# Patient Record
Sex: Female | Born: 1960 | Race: White | Hispanic: No | Marital: Married | Smoking: Never smoker
Health system: Southern US, Community
[De-identification: ages and names within clinical notes are randomized; demographics above are authoritative.]

## PROBLEM LIST (undated history)

## (undated) DIAGNOSIS — E782 Mixed hyperlipidemia: Secondary | ICD-10-CM

## (undated) DIAGNOSIS — C4491 Basal cell carcinoma of skin, unspecified: Secondary | ICD-10-CM

## (undated) DIAGNOSIS — B019 Varicella without complication: Secondary | ICD-10-CM

## (undated) DIAGNOSIS — M722 Plantar fascial fibromatosis: Secondary | ICD-10-CM

## (undated) DIAGNOSIS — N6019 Diffuse cystic mastopathy of unspecified breast: Secondary | ICD-10-CM

## (undated) DIAGNOSIS — T7840XA Allergy, unspecified, initial encounter: Secondary | ICD-10-CM

## (undated) DIAGNOSIS — C801 Malignant (primary) neoplasm, unspecified: Secondary | ICD-10-CM

## (undated) DIAGNOSIS — M2420 Disorder of ligament, unspecified site: Principal | ICD-10-CM

## (undated) HISTORY — PX: SKIN SURGERY: SHX2413

## (undated) HISTORY — DX: Disorder of ligament, unspecified site: M24.20

## (undated) HISTORY — DX: Allergy, unspecified, initial encounter: T78.40XA

## (undated) HISTORY — DX: Mixed hyperlipidemia: E78.2

## (undated) HISTORY — DX: Basal cell carcinoma of skin, unspecified: C44.91

## (undated) HISTORY — DX: Diffuse cystic mastopathy of unspecified breast: N60.19

## (undated) HISTORY — DX: Plantar fascial fibromatosis: M72.2

## (undated) HISTORY — DX: Varicella without complication: B01.9

## (undated) HISTORY — PX: BREAST SURGERY: SHX581

## (undated) HISTORY — DX: Malignant (primary) neoplasm, unspecified: C80.1

---

## 1998-11-20 ENCOUNTER — Inpatient Hospital Stay (HOSPITAL_COMMUNITY): Admission: RE | Admit: 1998-11-20 | Discharge: 1998-11-20 | Payer: Self-pay | Admitting: *Deleted

## 1998-11-20 ENCOUNTER — Encounter: Payer: Self-pay | Admitting: *Deleted

## 2008-12-08 HISTORY — PX: HERNIA REPAIR: SHX51

## 2011-12-09 LAB — HM PAP SMEAR: HM Pap smear: NORMAL

## 2011-12-09 LAB — HM COLONOSCOPY: HM Colonoscopy: NORMAL

## 2014-02-05 LAB — HM MAMMOGRAPHY: HM Mammogram: NORMAL

## 2014-05-02 ENCOUNTER — Ambulatory Visit (INDEPENDENT_AMBULATORY_CARE_PROVIDER_SITE_OTHER): Payer: Managed Care, Other (non HMO) | Admitting: Family Medicine

## 2014-05-02 ENCOUNTER — Encounter: Payer: Self-pay | Admitting: Family Medicine

## 2014-05-02 VITALS — BP 100/64 | HR 73 | Temp 97.9°F | Ht 67.0 in | Wt 134.1 lb

## 2014-05-02 DIAGNOSIS — N6019 Diffuse cystic mastopathy of unspecified breast: Secondary | ICD-10-CM

## 2014-05-02 DIAGNOSIS — C4491 Basal cell carcinoma of skin, unspecified: Secondary | ICD-10-CM

## 2014-05-02 DIAGNOSIS — T7840XA Allergy, unspecified, initial encounter: Secondary | ICD-10-CM

## 2014-05-02 DIAGNOSIS — E538 Deficiency of other specified B group vitamins: Secondary | ICD-10-CM

## 2014-05-02 DIAGNOSIS — B019 Varicella without complication: Secondary | ICD-10-CM | POA: Insufficient documentation

## 2014-05-02 DIAGNOSIS — Z0000 Encounter for general adult medical examination without abnormal findings: Secondary | ICD-10-CM

## 2014-05-02 NOTE — Progress Notes (Signed)
Pre visit review using our clinic review tool, if applicable. No additional management support is needed unless otherwise documented below in the visit note. 

## 2014-05-02 NOTE — Patient Instructions (Signed)

## 2014-05-05 ENCOUNTER — Telehealth: Payer: Self-pay | Admitting: Family Medicine

## 2014-05-05 NOTE — Telephone Encounter (Signed)
Received medical records from [facility] Para March

## 2014-05-07 ENCOUNTER — Encounter: Payer: Self-pay | Admitting: Family Medicine

## 2014-05-07 DIAGNOSIS — Z0000 Encounter for general adult medical examination without abnormal findings: Secondary | ICD-10-CM | POA: Insufficient documentation

## 2014-05-07 DIAGNOSIS — N6019 Diffuse cystic mastopathy of unspecified breast: Secondary | ICD-10-CM

## 2014-05-07 DIAGNOSIS — E538 Deficiency of other specified B group vitamins: Secondary | ICD-10-CM | POA: Insufficient documentation

## 2014-05-07 DIAGNOSIS — T7840XA Allergy, unspecified, initial encounter: Secondary | ICD-10-CM

## 2014-05-07 HISTORY — DX: Diffuse cystic mastopathy of unspecified breast: N60.19

## 2014-05-07 HISTORY — DX: Allergy, unspecified, initial encounter: T78.40XA

## 2014-05-07 NOTE — Progress Notes (Signed)
Patient ID: Dawn Burch, female   DOB: 10-25-1961, 53 y.o.   MRN: [id number] Dawn Burch [id number] Apr 30, 1961 05/07/2014      Progress Note-Follow Up  Subjective  Chief Complaint  Chief Complaint  Patient presents with  . Establish Care    new patient    HPI  Patient is a 53 year old female in today for routine medical care. In today to establish care. Has been struggling with some allergies recently but not enough to take medications. No fevers or chills just some congestion and pruritus. Is in need of a new primary care physician. Reports colonoscopy at age 68 was unremarkable. Denies CP/palp/SOB/HA/congestion/fevers/GI or GU c/o. Taking meds as prescribed  Past Medical History  Diagnosis Date  . Cancer 20's    basal- under R eye  . Chicken pox as a child  . Allergic state 05/07/2014  . Fibrocystic breast 05/07/2014  . BCC (basal cell carcinoma of skin)     basal- under R eye     Past Surgical History  Procedure Laterality Date  . Breast surgery  14 yrs ago    biopsy- left breast  . Skin surgery  20's    basal cell removal around right eye  . Hernia repair  2010    Family History  Problem Relation Age of Onset  . Heart Problems Mother     palpitations  . Cancer Mother 11    breast- left  . Diabetes Mother     type 2  . Cancer Maternal Grandmother 24    breast- left, skin on face, colon in 22s or 57s  . Glaucoma Maternal Grandfather     History   Social History  . Marital Status: Married    Spouse Name: N/A    Number of Children: N/A  . Years of Education: N/A   Occupational History  . Not on file.   Social History Main Topics  . Smoking status: Never Smoker   . Smokeless tobacco: Never Used  . Alcohol Use: Yes     Comment: occasionally  . Drug Use: No  . Sexual Activity: Yes    Partners: Male     Comment: minimizes wheat. lives with husband, son   Other Topics Concern  . Not on file   Social History Narrative  . No narrative on file     No current outpatient prescriptions on file prior to visit.   No current facility-administered medications on file prior to visit.    Allergies  Allergen Reactions  . Penicillins     Review of Systems  Review of Systems  Constitutional: Negative for fever and malaise/fatigue.  HENT: Positive for congestion.   Eyes: Negative for discharge.  Respiratory: Negative for shortness of breath.   Cardiovascular: Negative for chest pain, palpitations and leg swelling.  Gastrointestinal: Negative for nausea and diarrhea.  Genitourinary: Negative for dysuria.  Musculoskeletal: Negative for falls.  Skin: Negative for rash.  Neurological: Negative for loss of consciousness and headaches.  Endo/Heme/Allergies: Negative for polydipsia.  Psychiatric/Behavioral: Negative for depression and suicidal ideas. The patient is not nervous/anxious and does not have insomnia.     Objective  BP 100/64  Pulse 73  Temp(Src) 97.9 F (36.6 C) (Oral)  Ht 5\' 7"  (1.702 m)  Wt 134 lb 1.3 oz (60.818 kg)  BMI 20.99 kg/m2  SpO2 97%  LMP 04/25/2014  Physical Exam  Physical Exam  Constitutional: She is oriented to person, place, and time and well-developed, well-nourished, and in no distress.  No distress.  HENT:  Head: Normocephalic and atraumatic.  Eyes: Conjunctivae are normal.  Neck: Neck supple. No thyromegaly present.  Cardiovascular: Normal rate, regular rhythm and normal heart sounds.   No murmur heard. Pulmonary/Chest: Effort normal and breath sounds normal. She has no wheezes.  Abdominal: She exhibits no distension and no mass.  Musculoskeletal: She exhibits no edema.  Lymphadenopathy:    She has no cervical adenopathy.  Neurological: She is alert and oriented to person, place, and time.  Skin: Skin is warm and dry. No rash noted. She is not diaphoretic.  Psychiatric: Memory, affect and judgment normal.      Assessment & Plan  BCC (basal cell carcinoma of skin) Follows with  dermatology  Allergic state Recent development, declines meds, will notify us if worsens. Consider Zyrtec prn.  Preventative health care Agrees to return in 6 months for annual exam with GYN.  Vitamin B12 deficiency Will check level with next blood draw.

## 2014-05-07 NOTE — Assessment & Plan Note (Addendum)
Recent development, declines meds, will notify us if worsens. Consider Zyrtec prn.

## 2014-05-07 NOTE — Assessment & Plan Note (Signed)
Will check level with next blood draw 

## 2014-05-07 NOTE — Assessment & Plan Note (Signed)
Follows with dermatology 

## 2014-05-07 NOTE — Assessment & Plan Note (Signed)
Agrees to return in 6 months for annual exam with GYN.

## 2014-07-26 LAB — CBC
HCT: 36.9 % (ref 36.0–46.0)
Hemoglobin: 12.8 g/dL (ref 12.0–15.0)
MCH: 29.4 pg (ref 26.0–34.0)
MCHC: 34.7 g/dL (ref 30.0–36.0)
MCV: 84.8 fL (ref 78.0–100.0)
Platelets: 266 10*3/uL (ref 150–400)
RBC: 4.35 MIL/uL (ref 3.87–5.11)
RDW: 13.7 % (ref 11.5–15.5)
WBC: 4.6 10*3/uL (ref 4.0–10.5)

## 2014-07-26 LAB — LIPID PANEL
Cholesterol: 174 mg/dL (ref 0–200)
HDL: 58 mg/dL (ref >39)
LDL Cholesterol: 104 mg/dL — ABNORMAL HIGH (ref 0–99)
Total CHOL/HDL Ratio: 3 Ratio
Triglycerides: 59 mg/dL (ref <150)
VLDL: 12 mg/dL (ref 0–40)

## 2014-07-26 LAB — RENAL FUNCTION PANEL
Albumin: 4.2 g/dL (ref 3.5–5.2)
BUN: 13 mg/dL (ref 6–23)
CO2: 26 mEq/L (ref 19–32)
Calcium: 9.3 mg/dL (ref 8.4–10.5)
Chloride: 104 mEq/L (ref 96–112)
Creat: 0.95 mg/dL (ref 0.50–1.10)
Glucose, Bld: 88 mg/dL (ref 70–99)
Phosphorus: 3.9 mg/dL (ref 2.3–4.6)
Potassium: 4.6 mEq/L (ref 3.5–5.3)
Sodium: 139 mEq/L (ref 135–145)

## 2014-07-26 LAB — HEPATIC FUNCTION PANEL
ALT: <8 U/L (ref 0–35)
AST: 14 U/L (ref 0–37)
Albumin: 4.2 g/dL (ref 3.5–5.2)
Alkaline Phosphatase: 35 U/L — ABNORMAL LOW (ref 39–117)
Bilirubin, Direct: 0.1 mg/dL (ref 0.0–0.3)
Indirect Bilirubin: 0.6 mg/dL (ref 0.2–1.2)
Total Bilirubin: 0.7 mg/dL (ref 0.2–1.2)
Total Protein: 6.4 g/dL (ref 6.0–8.3)

## 2014-07-26 LAB — TSH: TSH: 1.412 u[IU]/mL (ref 0.350–4.500)

## 2014-07-26 LAB — VITAMIN B12: Vitamin B-12: 385 pg/mL (ref 211–911)

## 2014-10-03 ENCOUNTER — Ambulatory Visit (INDEPENDENT_AMBULATORY_CARE_PROVIDER_SITE_OTHER): Payer: Managed Care, Other (non HMO) | Admitting: Family Medicine

## 2014-10-03 ENCOUNTER — Encounter: Payer: Self-pay | Admitting: Family Medicine

## 2014-10-03 VITALS — BP 100/60 | HR 76 | Temp 97.6°F | Ht 67.0 in | Wt 129.8 lb

## 2014-10-03 DIAGNOSIS — N6019 Diffuse cystic mastopathy of unspecified breast: Secondary | ICD-10-CM

## 2014-10-03 DIAGNOSIS — Z0000 Encounter for general adult medical examination without abnormal findings: Secondary | ICD-10-CM

## 2014-10-03 NOTE — Progress Notes (Signed)
Pre visit review using our clinic review tool, if applicable. No additional management support is needed unless otherwise documented below in the visit note. 

## 2014-10-03 NOTE — Patient Instructions (Addendum)
Probiotic such as Digestive Advantage or Phillip's Colon Health daily   Fibrocystic Breast Changes Fibrocystic breast changes occur when breast ducts become blocked, causing painful, fluid-filled lumps (cysts) to form in the breast. This is a common condition that is noncancerous (benign). It occurs when women go through hormonal changes during their menstrual cycle. Fibrocystic breast changes can affect one or both breasts. CAUSES  The exact cause of fibrocystic breast changes is not known, but it may be related to the female hormones estrogen and progesterone. Family traits that get passed from parent to child (genetics) may also be a factor in some cases. SIGNS AND SYMPTOMS   Tenderness, mild discomfort, or pain.   Swelling.   Ropelike feeling when touching the breast.   Lumpy breast, one or both sides.   Changes in breast size, especially before (larger) and after (smaller) the menstrual period.   Green or dark brown nipple discharge (not blood).  Symptoms are usually worse before menstrual periods start and get better toward the end of the menstrual period.  DIAGNOSIS  To make a diagnosis, your health care provider will ask you questions and perform a physical exam of your breasts. The health care provider may recommend other tests that can examine inside your breasts, such as:  A breast X-ray (mammogram).   Ultrasonography.  An MRI.  If something more than fibrocystic breast changes is suspected, your health care provider may take a breast tissue sample (breast biopsy) to examine. TREATMENT  Often, treatment is not needed. Your health care provider may recommend over-the-counter pain relievers to help lessen pain or discomfort caused by the fibrocystic breast changes. You may also be asked to change your diet to limit or stop eating foods or drinking beverages that contain caffeine. Foods and beverages that contain caffeine include chocolate, soda, coffee, and tea.  Reducing sugar and fat in your diet may also help. Your health care provider may also recommend:  Fine needle aspiration to remove fluid from a cyst that is causing pain.   Surgery to remove a large, persistent, and tender cyst. HOME CARE INSTRUCTIONS   Examine your breasts after every menstrual period. If you do not have menstrual periods, check your breasts the first day of every month. Feel for changes, such as more tenderness, a new growth, a change in breast size, or a change in a lump that has always been there.   Only take over-the-counter or prescription medicine as directed by your health care provider.   Wear a well-fitted support or sports bra, especially when exercising.   Decrease or avoid caffeine, fat, and sugar in your diet as directed by your health care provider.  SEEK MEDICAL CARE IF:   You have fluid leaking (discharge) from your nipples, especially bloody discharge.   You have new lumps or bumps in the breast.   Your breast or breasts become enlarged, red, and painful.   You have areas of your breast that pucker in.   Your nipples appear flat or indented.  Document Released: 09/10/2006 Document Revised: 11/29/2013 Document Reviewed: 05/15/2013 [facility] Patient Information 2015 [place], [place]. This information is not intended to replace advice given to you by your health care provider. Make sure you discuss any questions you have with your health care provider.

## 2014-10-08 NOTE — Progress Notes (Signed)
Dawn Burch [id number] 12-Feb-1961 10/08/2014      Progress Note-Follow Up  Subjective  Chief Complaint  Chief Complaint  Patient presents with  . Annual Exam    physical    HPI  Patient is a 53 year old female in today for routine medical care. In for annual exam. Doing well. No recent illness. Has a history of fibrocystic breast changes and has been keeping up with MGMs. Denies CP/palp/SOB/HA/congestion/fevers/GI or GU c/o. Taking meds as prescribed  Past Medical History  Diagnosis Date  . Cancer 20's    basal- under R eye  . Chicken pox as a child  . Allergic state 05/07/2014  . Fibrocystic breast 05/07/2014  . BCC (basal cell carcinoma of skin)     basal- under R eye     Past Surgical History  Procedure Laterality Date  . Breast surgery  14 yrs ago    biopsy- left breast  . Skin surgery  20's    basal cell removal around right eye  . Hernia repair  2010    Family History  Problem Relation Age of Onset  . Heart Problems Mother     palpitations  . Cancer Mother 80    breast- left  . Diabetes Mother     type 2  . Cancer Maternal Grandmother 74    breast- left, skin on face, colon in 14s or 63s  . Glaucoma Maternal Grandfather     History   Social History  . Marital Status: Married    Spouse Name: N/A    Number of Children: N/A  . Years of Education: N/A   Occupational History  . Not on file.   Social History Main Topics  . Smoking status: Never Smoker   . Smokeless tobacco: Never Used  . Alcohol Use: Yes     Comment: occasionally  . Drug Use: No  . Sexual Activity:    Partners: Male     Comment: minimizes wheat. lives with husband, son   Other Topics Concern  . Not on file   Social History Narrative  . No narrative on file    No current outpatient prescriptions on file prior to visit.   No current facility-administered medications on file prior to visit.    Allergies  Allergen Reactions  . Penicillins     Review of  Systems  Review of Systems  Constitutional: Negative for fever and malaise/fatigue.  HENT: Negative for congestion.   Eyes: Negative for discharge.  Respiratory: Negative for shortness of breath.   Cardiovascular: Negative for chest pain, palpitations and leg swelling.  Gastrointestinal: Negative for nausea, abdominal pain and diarrhea.  Genitourinary: Negative for dysuria.  Musculoskeletal: Negative for falls.  Skin: Negative for rash.  Neurological: Negative for loss of consciousness and headaches.  Endo/Heme/Allergies: Negative for polydipsia.  Psychiatric/Behavioral: Negative for depression and suicidal ideas. The patient is not nervous/anxious and does not have insomnia.     Objective  BP 100/60 mmHg  Pulse 76  Temp(Src) 97.6 F (36.4 C) (Oral)  Ht 5\' 7"  (1.702 m)  Wt 129 lb 12.8 oz (58.877 kg)  BMI 20.32 kg/m2  SpO2 100%  LMP 10/03/2014  Physical Exam  Physical Exam  Constitutional: She is oriented to person, place, and time and well-developed, well-nourished, and in no distress. No distress.  HENT:  Head: Normocephalic and atraumatic.  Right Ear: External ear normal.  Left Ear: External ear normal.  Nose: Nose normal.  Mouth/Throat: Oropharynx is clear and moist. No oropharyngeal  exudate.  Eyes: Conjunctivae are normal. Pupils are equal, round, and reactive to light. Right eye exhibits no discharge. Left eye exhibits no discharge. No scleral icterus.  Neck: Normal range of motion. Neck supple. No thyromegaly present.  Cardiovascular: Normal rate, regular rhythm, normal heart sounds and intact distal pulses.   No murmur heard. Pulmonary/Chest: Effort normal and breath sounds normal. No respiratory distress. She has no wheezes. She has no rales.  Abdominal: Soft. Bowel sounds are normal. She exhibits no distension and no mass. There is no tenderness.  Musculoskeletal: Normal range of motion. She exhibits no edema or tenderness.  Lymphadenopathy:    She has no  cervical adenopathy.  Neurological: She is alert and oriented to person, place, and time. She has normal reflexes. No cranial nerve deficit. Coordination normal.  Skin: Skin is warm and dry. No rash noted. She is not diaphoretic.  Psychiatric: Mood, memory and affect normal.    Lab Results  Component Value Date   TSH 1.412 07/26/2014   Lab Results  Component Value Date   WBC 4.6 07/26/2014   HGB 12.8 07/26/2014   HCT 36.9 07/26/2014   MCV 84.8 07/26/2014   PLT 266 07/26/2014   Lab Results  Component Value Date   CREATININE 0.95 07/26/2014   BUN 13 07/26/2014   NA 139 07/26/2014   K 4.6 07/26/2014   CL 104 07/26/2014   CO2 26 07/26/2014   Lab Results  Component Value Date   ALT <8 07/26/2014   AST 14 07/26/2014   ALKPHOS 35* 07/26/2014   BILITOT 0.7 07/26/2014   Lab Results  Component Value Date   CHOL 174 07/26/2014   Lab Results  Component Value Date   HDL 58 07/26/2014   Lab Results  Component Value Date   LDLCALC 104* 07/26/2014   Lab Results  Component Value Date   TRIG 59 07/26/2014   Lab Results  Component Value Date   CHOLHDL 3.0 07/26/2014     Assessment & Plan  Preventative health care Patient encouraged to maintain heart healthy diet, regular exercise, adequate sleep. Consider daily probiotics. Take medications as prescribed. Declined flu shot and prevnar.   Fibrocystic breast No recent concerns.

## 2014-10-08 NOTE — Assessment & Plan Note (Signed)
Patient encouraged to maintain heart healthy diet, regular exercise, adequate sleep. Consider daily probiotics. Take medications as prescribed. Declined flu shot and prevnar.

## 2014-10-08 NOTE — Assessment & Plan Note (Signed)
No recent concerns. 

## 2015-02-02 ENCOUNTER — Encounter: Payer: Self-pay | Admitting: Family Medicine

## 2015-02-02 ENCOUNTER — Ambulatory Visit (INDEPENDENT_AMBULATORY_CARE_PROVIDER_SITE_OTHER): Payer: Managed Care, Other (non HMO) | Admitting: Family Medicine

## 2015-02-02 VITALS — BP 102/62 | HR 83 | Temp 98.5°F | Ht 67.5 in | Wt 133.4 lb

## 2015-02-02 DIAGNOSIS — M898X8 Other specified disorders of bone, other site: Secondary | ICD-10-CM

## 2015-02-02 DIAGNOSIS — M2420 Disorder of ligament, unspecified site: Secondary | ICD-10-CM

## 2015-02-02 NOTE — Progress Notes (Signed)
Pre visit review using our clinic review tool, if applicable. No additional management support is needed unless otherwise documented below in the visit note. 

## 2015-02-05 ENCOUNTER — Encounter: Payer: Self-pay | Admitting: Family Medicine

## 2015-02-05 DIAGNOSIS — M2420 Disorder of ligament, unspecified site: Secondary | ICD-10-CM

## 2015-02-05 HISTORY — DX: Disorder of ligament, unspecified site: M24.20

## 2015-02-05 NOTE — Progress Notes (Signed)
Rue Valladares  [id number] 1961/07/12 02/05/2015      Progress Note-Follow Up  Subjective  Chief Complaint  Chief Complaint  Dawn Burch presents with  . Follow-up    HPI  Dawn Burch is a 54 y.o. female in today for routine medical care. Dawn Burch is here today to discuss the diagnosis given to her by her dentist. 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 She has no pain or associated symptoms otherwise. No other recent illness or acute complaints. Denies CP/palp/SOB/HA/congestion/fevers/GI or GU c/o. Taking meds as prescribed  Past Medical History  Diagnosis Date  . Cancer 20's    basal- under R eye  . Chicken pox as a child  . Allergic state 05/07/2014  . Fibrocystic breast 05/07/2014  . BCC (basal cell carcinoma of skin)     basal- under R eye   . 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 Breast surgery  14 yrs ago    biopsy- left breast  . Skin surgery  20's    basal cell removal around right eye  . Hernia repair  2010    Family History  Problem Relation Age of Onset  . Heart Problems Mother     palpitations  . Cancer Mother 71    breast- left  . Diabetes Mother     type 2  . Cancer Maternal Grandmother 81    breast- left, skin on face, colon in 55s or 77s  . Glaucoma Maternal Grandfather     History   Social History  . Marital Status: Married    Spouse Name: N/A  . Number of Children: N/A  . Years of Education: N/A   Occupational History  . Not on file.   Social History Main Topics  . Smoking status: Never Smoker   . Smokeless tobacco: Never Used  . Alcohol Use: Yes     Comment: occasionally  . Drug Use: No  . Sexual Activity:    Partners: Male     Comment: minimizes wheat. lives with husband, son   Other Topics Concern  . Not on file   Social History Narrative    No current outpatient prescriptions on file prior to visit.   No current facility-administered  medications on file prior to visit.    Allergies  Allergen Reactions  . Penicillins     Review of Systems  Review of Systems  Constitutional: Negative for fever and malaise/fatigue.  HENT: Negative for congestion.   Eyes: Negative for discharge.  Respiratory: Negative for shortness of breath.   Cardiovascular: Negative for chest pain, palpitations and leg swelling.  Gastrointestinal: Negative for nausea, abdominal pain and diarrhea.  Genitourinary: Negative for dysuria.  Musculoskeletal: Negative for falls.  Skin: Negative for rash.  Neurological: Negative for loss of consciousness and headaches.  Endo/Heme/Allergies: Negative for polydipsia.  Psychiatric/Behavioral: Negative for depression and suicidal ideas. The Dawn Burch is nervous/anxious. The Dawn Burch does not have insomnia.     Objective  BP 102/62 mmHg  Pulse 83  Temp(Src) 98.5 F (36.9 C) (Oral)  Ht 5' 7.5" (1.715 m)  Wt 133 lb 6 oz (60.499 kg)  BMI 20.57 kg/m2  SpO2 98%  Physical Exam  Physical Exam  Constitutional: She is oriented to person, place, and time and well-developed, well-nourished, and in no distress. No distress.  HENT:  Head: Normocephalic and atraumatic.  Eyes: Conjunctivae are normal.  Neck: Neck supple. No thyromegaly present.  Cardiovascular:  Normal rate, regular rhythm and normal heart sounds.   No murmur heard. Pulmonary/Chest: Effort normal and breath sounds normal. She has no wheezes.  Abdominal: She exhibits no distension and no mass.  Musculoskeletal: She exhibits no edema.  Lymphadenopathy:    She has no cervical adenopathy.  Neurological: She is alert and oriented to person, place, and time.  Skin: Skin is warm and dry. No rash noted. She is not diaphoretic.  Psychiatric: Memory, affect and judgment normal.    Lab Results  Component Value Date   TSH 1.412 07/26/2014   Lab Results  Component Value Date   WBC 4.6 07/26/2014   HGB 12.8 07/26/2014   HCT 36.9 07/26/2014   MCV  84.8 07/26/2014   PLT 266 07/26/2014   Lab Results  Component Value Date   CREATININE 0.95 07/26/2014   BUN 13 07/26/2014   NA 139 07/26/2014   K 4.6 07/26/2014   CL 104 07/26/2014   CO2 26 07/26/2014   Lab Results  Component Value Date   ALT <8 07/26/2014   AST 14 07/26/2014   ALKPHOS 35* 07/26/2014   BILITOT 0.7 07/26/2014   Lab Results  Component Value Date   CHOL 174 07/26/2014   Lab Results  Component Value Date   HDL 58 07/26/2014   Lab Results  Component Value Date   LDLCALC 104* 07/26/2014   Lab Results  Component Value Date   TRIG 59 07/26/2014   Lab Results  Component Value Date   CHOLHDL 3.0 07/26/2014     Assessment & Plan  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 Will refer to ENT for further consideration

## 2015-02-05 NOTE — Assessment & Plan Note (Addendum)
Diagnosed by her dentist with xray, patient asymptomatic but now very worried. Will refer to ENT for further consideration

## 2015-10-08 ENCOUNTER — Telehealth: Payer: Self-pay | Admitting: Behavioral Health

## 2015-10-08 NOTE — Telephone Encounter (Signed)
Unable to reach patient at time of Pre-Visit Call.  Left message for patient to return call when available.    

## 2015-10-09 ENCOUNTER — Encounter: Payer: Self-pay | Admitting: Family Medicine

## 2015-10-09 ENCOUNTER — Ambulatory Visit (INDEPENDENT_AMBULATORY_CARE_PROVIDER_SITE_OTHER): Payer: 59 | Admitting: Family Medicine

## 2015-10-09 VITALS — BP 110/62 | HR 71 | Temp 98.3°F | Ht 67.5 in | Wt 133.2 lb

## 2015-10-09 DIAGNOSIS — Z1159 Encounter for screening for other viral diseases: Secondary | ICD-10-CM | POA: Diagnosis not present

## 2015-10-09 DIAGNOSIS — E782 Mixed hyperlipidemia: Secondary | ICD-10-CM

## 2015-10-09 DIAGNOSIS — Z0000 Encounter for general adult medical examination without abnormal findings: Secondary | ICD-10-CM | POA: Diagnosis not present

## 2015-10-09 DIAGNOSIS — E538 Deficiency of other specified B group vitamins: Secondary | ICD-10-CM | POA: Diagnosis not present

## 2015-10-09 DIAGNOSIS — Z1239 Encounter for other screening for malignant neoplasm of breast: Secondary | ICD-10-CM | POA: Diagnosis not present

## 2015-10-09 LAB — COMPREHENSIVE METABOLIC PANEL
ALT: 9 U/L (ref 0–35)
AST: 14 U/L (ref 0–37)
Albumin: 4.3 g/dL (ref 3.5–5.2)
Alkaline Phosphatase: 35 U/L — ABNORMAL LOW (ref 39–117)
BUN: 15 mg/dL (ref 6–23)
CALCIUM: 9.6 mg/dL (ref 8.4–10.5)
CHLORIDE: 104 meq/L (ref 96–112)
CO2: 30 meq/L (ref 19–32)
Creatinine, Ser: 0.88 mg/dL (ref 0.40–1.20)
GFR: 70.97 mL/min (ref >60.00)
GLUCOSE: 103 mg/dL — AB (ref 70–99)
Potassium: 4.6 mEq/L (ref 3.5–5.1)
Sodium: 139 mEq/L (ref 135–145)
Total Bilirubin: 0.6 mg/dL (ref 0.2–1.2)
Total Protein: 7.2 g/dL (ref 6.0–8.3)

## 2015-10-09 LAB — LIPID PANEL
CHOL/HDL RATIO: 3
Cholesterol: 185 mg/dL (ref 0–200)
HDL: 60.8 mg/dL (ref >39.00)
LDL Cholesterol: 112 mg/dL — ABNORMAL HIGH (ref 0–99)
NONHDL: 123.8
Triglycerides: 59 mg/dL (ref 0.0–149.0)
VLDL: 11.8 mg/dL (ref 0.0–40.0)

## 2015-10-09 LAB — CBC
HCT: 40.8 % (ref 36.0–46.0)
Hemoglobin: 13.5 g/dL (ref 12.0–15.0)
MCHC: 33.2 g/dL (ref 30.0–36.0)
MCV: 89.4 fl (ref 78.0–100.0)
Platelets: 294 10*3/uL (ref 150.0–400.0)
RBC: 4.57 Mil/uL (ref 3.87–5.11)
RDW: 14.4 % (ref 11.5–15.5)
WBC: 5.7 10*3/uL (ref 4.0–10.5)

## 2015-10-09 LAB — TSH: TSH: 0.61 u[IU]/mL (ref 0.35–4.50)

## 2015-10-09 LAB — HEPATITIS C ANTIBODY: HCV Ab: NEGATIVE

## 2015-10-09 LAB — VITAMIN B12: VITAMIN B 12: 408 pg/mL (ref 211–911)

## 2015-10-09 NOTE — Progress Notes (Signed)
Subjective:    Patient ID: Dawn Burch, female    DOB: December 21, 1960, 54 y.o.   MRN: [id number]  Chief Complaint  Patient presents with  . Annual Exam    HPI Patient is in today for annual exam. Feels well. Denies any GI complaints colonoscopy in 2013 was clear. No acute illness or recent concerns. Tries to maintain a heart healthy die and stay active. Denies CP/palp/SOB/HA/congestion/fevers/GI or GU c/o. Taking meds as prescribed  Past Medical History  Diagnosis Date  . Cancer ([place]) 20's    basal- under R eye  . Chicken pox as a child  . Allergic state 05/07/2014  . Fibrocystic breast 05/07/2014  . BCC (basal cell carcinoma of skin)     basal- under R eye   . 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 Hyperlipidemia, mixed 10/21/2015    Past Surgical History  Procedure Laterality Date  . Breast surgery  14 yrs ago    biopsy- left breast  . Skin surgery  20's    basal cell removal around right eye  . Hernia repair  2010    Family History  Problem Relation Age of Onset  . Heart Problems Mother     palpitations  . Cancer Mother 82    breast- left  . Diabetes Mother     type 2  . Cancer Maternal Grandmother 79    breast- left, skin on face, colon in 66s or 60s  . Glaucoma Maternal Grandfather     Social History   Social History  . Marital Status: Married    Spouse Name: N/A  . Number of Children: N/A  . Years of Education: N/A   Occupational History  . Not on file.   Social History Main Topics  . Smoking status: Never Smoker   . Smokeless tobacco: Never Used  . Alcohol Use: Yes     Comment: occasionally  . Drug Use: No  . Sexual Activity:    Partners: Male     Comment: minimizes wheat. lives with husband, son   Other Topics Concern  . Not on file   Social History Narrative    No outpatient prescriptions prior to visit.   No facility-administered medications prior to visit.    Allergies  Allergen Reactions  . Penicillins     Review of Systems    Constitutional: Negative for fever, chills and malaise/fatigue.  HENT: Negative for congestion and hearing loss.   Eyes: Negative for discharge.  Respiratory: Negative for cough, sputum production and shortness of breath.   Cardiovascular: Negative for chest pain, palpitations and leg swelling.  Gastrointestinal: Negative for heartburn, nausea, vomiting, abdominal pain, diarrhea, constipation and blood in stool.  Genitourinary: Negative for dysuria, urgency, frequency and hematuria.  Musculoskeletal: Negative for myalgias, back pain and falls.  Skin: Negative for rash.  Neurological: Negative for dizziness, sensory change, loss of consciousness, weakness and headaches.  Endo/Heme/Allergies: Negative for environmental allergies. Does not bruise/bleed easily.  Psychiatric/Behavioral: Negative for depression and suicidal ideas. The patient is not nervous/anxious and does not have insomnia.        Objective:    Physical Exam  Constitutional: She is oriented to person, place, and time. She appears well-developed and well-nourished. No distress.  HENT:  Head: Normocephalic and atraumatic.  Eyes: Conjunctivae are normal.  Neck: Neck supple. No thyromegaly present.  Cardiovascular: Normal rate, regular rhythm and normal heart sounds.   No murmur heard. Pulmonary/Chest: Effort normal and breath sounds normal. No respiratory distress.  Abdominal: Soft.  Bowel sounds are normal. She exhibits no distension and no mass. There is no tenderness.  Musculoskeletal: She exhibits no edema.  Lymphadenopathy:    She has no cervical adenopathy.  Neurological: She is alert and oriented to person, place, and time.  Skin: Skin is warm and dry.  Psychiatric: She has a normal mood and affect. Her behavior is normal.    BP 110/62 mmHg  Pulse 71  Temp(Src) 98.3 F (36.8 C) (Oral)  Ht 5' 7.5" (1.715 m)  Wt 133 lb 4 oz (60.442 kg)  BMI 20.55 kg/m2  SpO2 97%  LMP 10/07/2015 Wt Readings from Last 3  Encounters:  10/09/15 133 lb 4 oz (60.442 kg)  02/02/15 133 lb 6 oz (60.499 kg)  10/03/14 129 lb 12.8 oz (58.877 kg)     Lab Results  Component Value Date   WBC 5.7 10/09/2015   HGB 13.5 10/09/2015   HCT 40.8 10/09/2015   PLT 294.0 10/09/2015   GLUCOSE 103* 10/09/2015   CHOL 185 10/09/2015   TRIG 59.0 10/09/2015   HDL 60.80 10/09/2015   LDLCALC 112* 10/09/2015   ALT 9 10/09/2015   AST 14 10/09/2015   NA 139 10/09/2015   K 4.6 10/09/2015   CL 104 10/09/2015   CREATININE 0.88 10/09/2015   BUN 15 10/09/2015   CO2 30 10/09/2015   TSH 0.61 10/09/2015    Lab Results  Component Value Date   TSH 0.61 10/09/2015   Lab Results  Component Value Date   WBC 5.7 10/09/2015   HGB 13.5 10/09/2015   HCT 40.8 10/09/2015   MCV 89.4 10/09/2015   PLT 294.0 10/09/2015   Lab Results  Component Value Date   NA 139 10/09/2015   K 4.6 10/09/2015   CO2 30 10/09/2015   GLUCOSE 103* 10/09/2015   BUN 15 10/09/2015   CREATININE 0.88 10/09/2015   BILITOT 0.6 10/09/2015   ALKPHOS 35* 10/09/2015   AST 14 10/09/2015   ALT 9 10/09/2015   PROT 7.2 10/09/2015   ALBUMIN 4.3 10/09/2015   CALCIUM 9.6 10/09/2015   GFR 70.97 10/09/2015   Lab Results  Component Value Date   CHOL 185 10/09/2015   Lab Results  Component Value Date   HDL 60.80 10/09/2015   Lab Results  Component Value Date   LDLCALC 112* 10/09/2015   Lab Results  Component Value Date   TRIG 59.0 10/09/2015   Lab Results  Component Value Date   CHOLHDL 3 10/09/2015   No results found for: HGBA1C     Assessment & Plan:   Problem List Items Addressed This Visit    Vitamin B12 deficiency - Primary    Continue supplements      Relevant Orders   TSH (Completed)   CBC (Completed)   Lipid panel (Completed)   Vitamin B12 (Completed)   Comprehensive metabolic panel (Completed)   Hepatitis C Antibody (Completed)   HIV antibody (with reflex) (Completed)   Preventative health care    Patient encouraged to  maintain heart healthy diet, regular exercise, adequate sleep. Consider daily probiotics. Take medications as prescribed. Labs ordered and reviewed. MGM ordered. Colonoscopy in 2013 repeat in 2023      Relevant Orders   TSH (Completed)   CBC (Completed)   Lipid panel (Completed)   Vitamin B12 (Completed)   Comprehensive metabolic panel (Completed)   Hepatitis C Antibody (Completed)   HIV antibody (with reflex) (Completed)   Hyperlipidemia, mixed    Encouraged heart healthy diet, increase exercise, avoid  trans fats, consider a krill oil cap daily       Other Visit Diagnoses    Screening for viral disease        Relevant Orders    TSH (Completed)    CBC (Completed)    Lipid panel (Completed)    Vitamin B12 (Completed)    Comprehensive metabolic panel (Completed)    Hepatitis C Antibody (Completed)    HIV antibody (with reflex) (Completed)    Breast cancer screening        Relevant Orders    MM SCREENING BREAST TOMO BILATERAL (Completed)       Ms. Archibeque does not currently have medications on file.  No orders of the defined types were placed in this encounter.     Penni Homans, MD

## 2015-10-09 NOTE — Progress Notes (Signed)
Pre visit review using our clinic review tool, if applicable. No additional management support is needed unless otherwise documented below in the visit note. 

## 2015-10-09 NOTE — Patient Instructions (Signed)
Preventive Care for Adults, Female A healthy lifestyle and preventive care can promote health and wellness. Preventive health guidelines for women include the following key practices.  A routine yearly physical is a good way to check with your health care provider about your health and preventive screening. It is a chance to share any concerns and updates on your health and to receive a thorough exam.  Visit your dentist for a routine exam and preventive care every 6 months. Brush your teeth twice a day and floss once a day. Good oral hygiene prevents tooth decay and gum disease.  The frequency of eye exams is based on your age, health, family medical history, use of contact lenses, and other factors. Follow your health care provider's recommendations for frequency of eye exams.  Eat a healthy diet. Foods like vegetables, fruits, whole grains, low-fat dairy products, and lean protein foods contain the nutrients you need without too many calories. Decrease your intake of foods high in solid fats, added sugars, and salt. Eat the right amount of calories for you.Get information about a proper diet from your health care provider, if necessary.  Regular physical exercise is one of the most important things you can do for your health. Most adults should get at least 150 minutes of moderate-intensity exercise (any activity that increases your heart rate and causes you to sweat) each week. In addition, most adults need muscle-strengthening exercises on 2 or more days a week.  Maintain a healthy weight. The body mass index (BMI) is a screening tool to identify possible weight problems. It provides an estimate of body fat based on height and weight. Your health care provider can find your BMI and can help you achieve or maintain a healthy weight.For adults 20 years and older:  A BMI below 18.5 is considered underweight.  A BMI of 18.5 to 24.9 is normal.  A BMI of 25 to 29.9 is considered overweight.  A  BMI of 30 and above is considered obese.  Maintain normal blood lipids and cholesterol levels by exercising and minimizing your intake of saturated fat. Eat a balanced diet with plenty of fruit and vegetables. Blood tests for lipids and cholesterol should begin at age 45 and be repeated every 5 years. If your lipid or cholesterol levels are high, you are over 50, or you are at high risk for heart disease, you may need your cholesterol levels checked more frequently.Ongoing high lipid and cholesterol levels should be treated with medicines if diet and exercise are not working.  If you smoke, find out from your health care provider how to quit. If you do not use tobacco, do not start.  Lung cancer screening is recommended for adults aged 45-80 years who are at high risk for developing lung cancer because of a history of smoking. A yearly low-dose CT scan of the lungs is recommended for people who have at least a 30-pack-year history of smoking and are a current smoker or have quit within the past 15 years. A pack year of smoking is smoking an average of 1 pack of cigarettes a day for 1 year (for example: 1 pack a day for 30 years or 2 packs a day for 15 years). Yearly screening should continue until the smoker has stopped smoking for at least 15 years. Yearly screening should be stopped for people who develop a health problem that would prevent them from having lung cancer treatment.  If you are pregnant, do not drink alcohol. If you are  breastfeeding, be very cautious about drinking alcohol. If you are not pregnant and choose to drink alcohol, do not have more than 1 drink per day. One drink is considered to be 12 ounces (355 mL) of beer, 5 ounces (148 mL) of wine, or 1.5 ounces (44 mL) of liquor.  Avoid use of street drugs. Do not share needles with anyone. Ask for help if you need support or instructions about stopping the use of drugs.  High blood pressure causes heart disease and increases the risk  of stroke. Your blood pressure should be checked at least every 1 to 2 years. Ongoing high blood pressure should be treated with medicines if weight loss and exercise do not work.  If you are 55-79 years old, ask your health care provider if you should take aspirin to prevent strokes.  Diabetes screening is done by taking a blood sample to check your blood glucose level after you have not eaten for a certain period of time (fasting). If you are not overweight and you do not have risk factors for diabetes, you should be screened once every 3 years starting at age 45. If you are overweight or obese and you are 40-70 years of age, you should be screened for diabetes every year as part of your cardiovascular risk assessment.  Breast cancer screening is essential preventive care for women. You should practice "breast self-awareness." This means understanding the normal appearance and feel of your breasts and may include breast self-examination. Any changes detected, no matter how small, should be reported to a health care provider. Women in their 20s and 30s should have a clinical breast exam (CBE) by a health care provider as part of a regular health exam every 1 to 3 years. After age 40, women should have a CBE every year. Starting at age 40, women should consider having a mammogram (breast X-ray test) every year. Women who have a family history of breast cancer should talk to their health care provider about genetic screening. Women at a high risk of breast cancer should talk to their health care providers about having an MRI and a mammogram every year.  Breast cancer gene (BRCA)-related cancer risk assessment is recommended for women who have family members with BRCA-related cancers. BRCA-related cancers include breast, ovarian, tubal, and peritoneal cancers. Having family members with these cancers may be associated with an increased risk for harmful changes (mutations) in the breast cancer genes BRCA1 and  BRCA2. Results of the assessment will determine the need for genetic counseling and BRCA1 and BRCA2 testing.  Your health care provider may recommend that you be screened regularly for cancer of the pelvic organs (ovaries, uterus, and vagina). This screening involves a pelvic examination, including checking for microscopic changes to the surface of your cervix (Pap test). You may be encouraged to have this screening done every 3 years, beginning at age 21.  For women ages 30-65, health care providers may recommend pelvic exams and Pap testing every 3 years, or they may recommend the Pap and pelvic exam, combined with testing for human papilloma virus (HPV), every 5 years. Some types of HPV increase your risk of cervical cancer. Testing for HPV may also be done on women of any age with unclear Pap test results.  Other health care providers may not recommend any screening for nonpregnant women who are considered low risk for pelvic cancer and who do not have symptoms. Ask your health care provider if a screening pelvic exam is right for   you.  If you have had past treatment for cervical cancer or a condition that could lead to cancer, you need Pap tests and screening for cancer for at least 20 years after your treatment. If Pap tests have been discontinued, your risk factors (such as having a new sexual partner) need to be reassessed to determine if screening should resume. Some women have medical problems that increase the chance of getting cervical cancer. In these cases, your health care provider may recommend more frequent screening and Pap tests.  Colorectal cancer can be detected and often prevented. Most routine colorectal cancer screening begins at the age of 50 years and continues through age 75 years. However, your health care provider may recommend screening at an earlier age if you have risk factors for colon cancer. On a yearly basis, your health care provider may provide home test kits to check  for hidden blood in the stool. Use of a small camera at the end of a tube, to directly examine the colon (sigmoidoscopy or colonoscopy), can detect the earliest forms of colorectal cancer. Talk to your health care provider about this at age 50, when routine screening begins. Direct exam of the colon should be repeated every 5-10 years through age 75 years, unless early forms of precancerous polyps or small growths are found.  People who are at an increased risk for hepatitis B should be screened for this virus. You are considered at high risk for hepatitis B if:  You were born in a country where hepatitis B occurs often. Talk with your health care provider about which countries are considered high risk.  Your parents were born in a high-risk country and you have not received a shot to protect against hepatitis B (hepatitis B vaccine).  You have HIV or AIDS.  You use needles to inject street drugs.  You live with, or have sex with, someone who has hepatitis B.  You get hemodialysis treatment.  You take certain medicines for conditions like cancer, organ transplantation, and autoimmune conditions.  Hepatitis C blood testing is recommended for all people born from 1945 through 1965 and any individual with known risks for hepatitis C.  Practice safe sex. Use condoms and avoid high-risk sexual practices to reduce the spread of sexually transmitted infections (STIs). STIs include gonorrhea, chlamydia, syphilis, trichomonas, herpes, HPV, and human immunodeficiency virus (HIV). Herpes, HIV, and HPV are viral illnesses that have no cure. They can result in disability, cancer, and death.  You should be screened for sexually transmitted illnesses (STIs) including gonorrhea and chlamydia if:  You are sexually active and are younger than 24 years.  You are older than 24 years and your health care provider tells you that you are at risk for this type of infection.  Your sexual activity has changed  since you were last screened and you are at an increased risk for chlamydia or gonorrhea. Ask your health care provider if you are at risk.  If you are at risk of being infected with HIV, it is recommended that you take a prescription medicine daily to prevent HIV infection. This is called preexposure prophylaxis (PrEP). You are considered at risk if:  You are sexually active and do not regularly use condoms or know the HIV status of your partner(s).  You take drugs by injection.  You are sexually active with a partner who has HIV.  Talk with your health care provider about whether you are at high risk of being infected with HIV. If   you choose to begin PrEP, you should first be tested for HIV. You should then be tested every 3 months for as long as you are taking PrEP.  Osteoporosis is a disease in which the bones lose minerals and strength with aging. This can result in serious bone fractures or breaks. The risk of osteoporosis can be identified using a bone density scan. Women ages 67 years and over and women at risk for fractures or osteoporosis should discuss screening with their health care providers. Ask your health care provider whether you should take a calcium supplement or vitamin D to reduce the rate of osteoporosis.  Menopause can be associated with physical symptoms and risks. Hormone replacement therapy is available to decrease symptoms and risks. You should talk to your health care provider about whether hormone replacement therapy is right for you.  Use sunscreen. Apply sunscreen liberally and repeatedly throughout the day. You should seek shade when your shadow is shorter than you. Protect yourself by wearing long sleeves, pants, a wide-brimmed hat, and sunglasses year round, whenever you are outdoors.  Once a month, do a whole body skin exam, using a mirror to look at the skin on your back. Tell your health care provider of new moles, moles that have irregular borders, moles that  are larger than a pencil eraser, or moles that have changed in shape or color.  Stay current with required vaccines (immunizations).  Influenza vaccine. All adults should be immunized every year.  Tetanus, diphtheria, and acellular pertussis (Td, Tdap) vaccine. Pregnant women should receive 1 dose of Tdap vaccine during each pregnancy. The dose should be obtained regardless of the length of time since the last dose. Immunization is preferred during the 27th-36th week of gestation. An adult who has not previously received Tdap or who does not know her vaccine status should receive 1 dose of Tdap. This initial dose should be followed by tetanus and diphtheria toxoids (Td) booster doses every 10 years. Adults with an unknown or incomplete history of completing a 3-dose immunization series with Td-containing vaccines should begin or complete a primary immunization series including a Tdap dose. Adults should receive a Td booster every 10 years.  Varicella vaccine. An adult without evidence of immunity to varicella should receive 2 doses or a second dose if she has previously received 1 dose. Pregnant females who do not have evidence of immunity should receive the first dose after pregnancy. This first dose should be obtained before leaving the health care facility. The second dose should be obtained 4-8 weeks after the first dose.  Human papillomavirus (HPV) vaccine. Females aged 13-26 years who have not received the vaccine previously should obtain the 3-dose series. The vaccine is not recommended for use in pregnant females. However, pregnancy testing is not needed before receiving a dose. If a female is found to be pregnant after receiving a dose, no treatment is needed. In that case, the remaining doses should be delayed until after the pregnancy. Immunization is recommended for any person with an immunocompromised condition through the age of 61 years if she did not get any or all doses earlier. During the  3-dose series, the second dose should be obtained 4-8 weeks after the first dose. The third dose should be obtained 24 weeks after the first dose and 16 weeks after the second dose.  Zoster vaccine. One dose is recommended for adults aged 30 years or older unless certain conditions are present.  Measles, mumps, and rubella (MMR) vaccine. Adults born  before 1957 generally are considered immune to measles and mumps. Adults born in 1957 or later should have 1 or more doses of MMR vaccine unless there is a contraindication to the vaccine or there is laboratory evidence of immunity to each of the three diseases. A routine second dose of MMR vaccine should be obtained at least 28 days after the first dose for students attending postsecondary schools, health care workers, or international travelers. People who received inactivated measles vaccine or an unknown type of measles vaccine during 1963-1967 should receive 2 doses of MMR vaccine. People who received inactivated mumps vaccine or an unknown type of mumps vaccine before 1979 and are at high risk for mumps infection should consider immunization with 2 doses of MMR vaccine. For females of childbearing age, rubella immunity should be determined. If there is no evidence of immunity, females who are not pregnant should be vaccinated. If there is no evidence of immunity, females who are pregnant should delay immunization until after pregnancy. Unvaccinated health care workers born before 1957 who lack laboratory evidence of measles, mumps, or rubella immunity or laboratory confirmation of disease should consider measles and mumps immunization with 2 doses of MMR vaccine or rubella immunization with 1 dose of MMR vaccine.  Pneumococcal 13-valent conjugate (PCV13) vaccine. When indicated, a person who is uncertain of his immunization history and has no record of immunization should receive the PCV13 vaccine. All adults 65 years of age and older should receive this  vaccine. An adult aged 19 years or older who has certain medical conditions and has not been previously immunized should receive 1 dose of PCV13 vaccine. This PCV13 should be followed with a dose of pneumococcal polysaccharide (PPSV23) vaccine. Adults who are at high risk for pneumococcal disease should obtain the PPSV23 vaccine at least 8 weeks after the dose of PCV13 vaccine. Adults older than 54 years of age who have normal immune system function should obtain the PPSV23 vaccine dose at least 1 year after the dose of PCV13 vaccine.  Pneumococcal polysaccharide (PPSV23) vaccine. When PCV13 is also indicated, PCV13 should be obtained first. All adults aged 65 years and older should be immunized. An adult younger than age 65 years who has certain medical conditions should be immunized. Any person who resides in a nursing home or long-term care facility should be immunized. An adult smoker should be immunized. People with an immunocompromised condition and certain other conditions should receive both PCV13 and PPSV23 vaccines. People with human immunodeficiency virus (HIV) infection should be immunized as soon as possible after diagnosis. Immunization during chemotherapy or radiation therapy should be avoided. Routine use of PPSV23 vaccine is not recommended for American Indians, [place] Natives, or people younger than 65 years unless there are medical conditions that require PPSV23 vaccine. When indicated, people who have unknown immunization and have no record of immunization should receive PPSV23 vaccine. One-time revaccination 5 years after the first dose of PPSV23 is recommended for people aged 19-64 years who have chronic kidney failure, nephrotic syndrome, asplenia, or immunocompromised conditions. People who received 1-2 doses of PPSV23 before age 65 years should receive another dose of PPSV23 vaccine at age 65 years or later if at least 5 years have passed since the previous dose. Doses of PPSV23 are not  needed for people immunized with PPSV23 at or after age 65 years.  Meningococcal vaccine. Adults with asplenia or persistent complement component deficiencies should receive 2 doses of quadrivalent meningococcal conjugate (MenACWY-D) vaccine. The doses should be obtained   at least 2 months apart. Microbiologists working with certain meningococcal bacteria, [place] recruits, people at risk during an outbreak, and people who travel to or live in countries with a high rate of meningitis should be immunized. A first-year college student up through age 34 years who is living in a residence hall should receive a dose if she did not receive a dose on or after her 16th birthday. Adults who have certain high-risk conditions should receive one or more doses of vaccine.  Hepatitis A vaccine. Adults who wish to be protected from this disease, have certain high-risk conditions, work with hepatitis A-infected animals, work in hepatitis A research labs, or travel to or work in countries with a high rate of hepatitis A should be immunized. Adults who were previously unvaccinated and who anticipate close contact with an international adoptee during the first 60 days after arrival in the [place] from a country with a high rate of hepatitis A should be immunized.  Hepatitis B vaccine. Adults who wish to be protected from this disease, have certain high-risk conditions, may be exposed to blood or other infectious body fluids, are household contacts or sex partners of hepatitis B positive people, are clients or workers in certain care facilities, or travel to or work in countries with a high rate of hepatitis B should be immunized.  Haemophilus influenzae type b (Hib) vaccine. A previously unvaccinated person with asplenia or sickle cell disease or having a scheduled splenectomy should receive 1 dose of Hib vaccine. Regardless of previous immunization, a recipient of a hematopoietic stem cell transplant should receive a  3-dose series 6-12 months after her successful transplant. Hib vaccine is not recommended for adults with HIV infection. Preventive Services / Frequency Ages 35 to 4 years  Blood pressure check.** / Every 3-5 years.  Lipid and cholesterol check.** / Every 5 years beginning at age 60.  Clinical breast exam.** / Every 3 years for women in their 71s and 10s.  BRCA-related cancer risk assessment.** / For women who have family members with a BRCA-related cancer (breast, ovarian, tubal, or peritoneal cancers).  Pap test.** / Every 2 years from ages 76 through 26. Every 3 years starting at age 61 through age 76 or 93 with a history of 3 consecutive normal Pap tests.  HPV screening.** / Every 3 years from ages 37 through ages 60 to 51 with a history of 3 consecutive normal Pap tests.  Hepatitis C blood test.** / For any individual with known risks for hepatitis C.  Skin self-exam. / Monthly.  Influenza vaccine. / Every year.  Tetanus, diphtheria, and acellular pertussis (Tdap, Td) vaccine.** / Consult your health care provider. Pregnant women should receive 1 dose of Tdap vaccine during each pregnancy. 1 dose of Td every 10 years.  Varicella vaccine.** / Consult your health care provider. Pregnant females who do not have evidence of immunity should receive the first dose after pregnancy.  HPV vaccine. / 3 doses over 6 months, if 93 and younger. The vaccine is not recommended for use in pregnant females. However, pregnancy testing is not needed before receiving a dose.  Measles, mumps, rubella (MMR) vaccine.** / You need at least 1 dose of MMR if you were born in 1957 or later. You may also need a 2nd dose. For females of childbearing age, rubella immunity should be determined. If there is no evidence of immunity, females who are not pregnant should be vaccinated. If there is no evidence of immunity, females who are  pregnant should delay immunization until after pregnancy.  Pneumococcal  13-valent conjugate (PCV13) vaccine.** / Consult your health care provider.  Pneumococcal polysaccharide (PPSV23) vaccine.** / 1 to 2 doses if you smoke cigarettes or if you have certain conditions.  Meningococcal vaccine.** / 1 dose if you are age 68 to 8 years and a Market researcher living in a residence hall, or have one of several medical conditions, you need to get vaccinated against meningococcal disease. You may also need additional booster doses.  Hepatitis A vaccine.** / Consult your health care provider.  Hepatitis B vaccine.** / Consult your health care provider.  Haemophilus influenzae type b (Hib) vaccine.** / Consult your health care provider. Ages 7 to 53 years  Blood pressure check.** / Every year.  Lipid and cholesterol check.** / Every 5 years beginning at age 25 years.  Lung cancer screening. / Every year if you are aged 11-80 years and have a 30-pack-year history of smoking and currently smoke or have quit within the past 15 years. Yearly screening is stopped once you have quit smoking for at least 15 years or develop a health problem that would prevent you from having lung cancer treatment.  Clinical breast exam.** / Every year after age 48 years.  BRCA-related cancer risk assessment.** / For women who have family members with a BRCA-related cancer (breast, ovarian, tubal, or peritoneal cancers).  Mammogram.** / Every year beginning at age 41 years and continuing for as long as you are in good health. Consult with your health care provider.  Pap test.** / Every 3 years starting at age 65 years through age 37 or 70 years with a history of 3 consecutive normal Pap tests.  HPV screening.** / Every 3 years from ages 72 years through ages 60 to 40 years with a history of 3 consecutive normal Pap tests.  Fecal occult blood test (FOBT) of stool. / Every year beginning at age 21 years and continuing until age 5 years. You may not need to do this test if you get  a colonoscopy every 10 years.  Flexible sigmoidoscopy or colonoscopy.** / Every 5 years for a flexible sigmoidoscopy or every 10 years for a colonoscopy beginning at age 35 years and continuing until age 48 years.  Hepatitis C blood test.** / For all people born from [birth year] through 1965 and any individual with known risks for hepatitis C.  Skin self-exam. / Monthly.  Influenza vaccine. / Every year.  Tetanus, diphtheria, and acellular pertussis (Tdap/Td) vaccine.** / Consult your health care provider. Pregnant women should receive 1 dose of Tdap vaccine during each pregnancy. 1 dose of Td every 10 years.  Varicella vaccine.** / Consult your health care provider. Pregnant females who do not have evidence of immunity should receive the first dose after pregnancy.  Zoster vaccine.** / 1 dose for adults aged 30 years or older.  Measles, mumps, rubella (MMR) vaccine.** / You need at least 1 dose of MMR if you were born in 1957 or later. You may also need a second dose. For females of childbearing age, rubella immunity should be determined. If there is no evidence of immunity, females who are not pregnant should be vaccinated. If there is no evidence of immunity, females who are pregnant should delay immunization until after pregnancy.  Pneumococcal 13-valent conjugate (PCV13) vaccine.** / Consult your health care provider.  Pneumococcal polysaccharide (PPSV23) vaccine.** / 1 to 2 doses if you smoke cigarettes or if you have certain conditions.  Meningococcal vaccine.** /  Consult your health care provider.  Hepatitis A vaccine.** / Consult your health care provider.  Hepatitis B vaccine.** / Consult your health care provider.  Haemophilus influenzae type b (Hib) vaccine.** / Consult your health care provider. Ages 64 years and over  Blood pressure check.** / Every year.  Lipid and cholesterol check.** / Every 5 years beginning at age 23 years.  Lung cancer screening. / Every year if you  are aged 16-80 years and have a 30-pack-year history of smoking and currently smoke or have quit within the past 15 years. Yearly screening is stopped once you have quit smoking for at least 15 years or develop a health problem that would prevent you from having lung cancer treatment.  Clinical breast exam.** / Every year after age 74 years.  BRCA-related cancer risk assessment.** / For women who have family members with a BRCA-related cancer (breast, ovarian, tubal, or peritoneal cancers).  Mammogram.** / Every year beginning at age 44 years and continuing for as long as you are in good health. Consult with your health care provider.  Pap test.** / Every 3 years starting at age 58 years through age 22 or 39 years with 3 consecutive normal Pap tests. Testing can be stopped between 65 and 70 years with 3 consecutive normal Pap tests and no abnormal Pap or HPV tests in the past 10 years.  HPV screening.** / Every 3 years from ages 64 years through ages 70 or 61 years with a history of 3 consecutive normal Pap tests. Testing can be stopped between 65 and 70 years with 3 consecutive normal Pap tests and no abnormal Pap or HPV tests in the past 10 years.  Fecal occult blood test (FOBT) of stool. / Every year beginning at age 40 years and continuing until age 27 years. You may not need to do this test if you get a colonoscopy every 10 years.  Flexible sigmoidoscopy or colonoscopy.** / Every 5 years for a flexible sigmoidoscopy or every 10 years for a colonoscopy beginning at age 7 years and continuing until age 32 years.  Hepatitis C blood test.** / For all people born from [birth year] through 1965 and any individual with known risks for hepatitis C.  Osteoporosis screening.** / A one-time screening for women ages 30 years and over and women at risk for fractures or osteoporosis.  Skin self-exam. / Monthly.  Influenza vaccine. / Every year.  Tetanus, diphtheria, and acellular pertussis (Tdap/Td)  vaccine.** / 1 dose of Td every 10 years.  Varicella vaccine.** / Consult your health care provider.  Zoster vaccine.** / 1 dose for adults aged 35 years or older.  Pneumococcal 13-valent conjugate (PCV13) vaccine.** / Consult your health care provider.  Pneumococcal polysaccharide (PPSV23) vaccine.** / 1 dose for all adults aged 46 years and older.  Meningococcal vaccine.** / Consult your health care provider.  Hepatitis A vaccine.** / Consult your health care provider.  Hepatitis B vaccine.** / Consult your health care provider.  Haemophilus influenzae type b (Hib) vaccine.** / Consult your health care provider. ** Family history and personal history of risk and conditions may change your health care provider's recommendations.   This information is not intended to replace advice given to you by your health care provider. Make sure you discuss any questions you have with your health care provider.   Document Released: 01/20/2002 Document Revised: 12/15/2014 Document Reviewed: 04/21/2011 Elsevier Interactive Patient Education [employer].

## 2015-10-10 LAB — HIV ANTIBODY (ROUTINE TESTING W REFLEX): HIV: NONREACTIVE

## 2015-10-11 ENCOUNTER — Ambulatory Visit (HOSPITAL_BASED_OUTPATIENT_CLINIC_OR_DEPARTMENT_OTHER)
Admission: RE | Admit: 2015-10-11 | Discharge: 2015-10-11 | Disposition: A | Discharge status: Home / Self Care | Payer: 59 | Source: Ambulatory Visit | Attending: Family Medicine | Admitting: Family Medicine

## 2015-10-11 DIAGNOSIS — Z1239 Encounter for other screening for malignant neoplasm of breast: Secondary | ICD-10-CM

## 2015-10-11 DIAGNOSIS — Z1231 Encounter for screening mammogram for malignant neoplasm of breast: Secondary | ICD-10-CM | POA: Diagnosis not present

## 2015-10-21 ENCOUNTER — Encounter: Payer: Self-pay | Admitting: Family Medicine

## 2015-10-21 DIAGNOSIS — E782 Mixed hyperlipidemia: Secondary | ICD-10-CM

## 2015-10-21 HISTORY — DX: Mixed hyperlipidemia: E78.2

## 2015-10-21 NOTE — Assessment & Plan Note (Addendum)
Patient encouraged to maintain heart healthy diet, regular exercise, adequate sleep. Consider daily probiotics. Take medications as prescribed. Labs ordered and reviewed. MGM ordered. Colonoscopy in 2013 repeat in 2023

## 2015-10-21 NOTE — Assessment & Plan Note (Signed)
Continue supplements

## 2015-10-21 NOTE — Assessment & Plan Note (Signed)
Encouraged heart healthy diet, increase exercise, avoid trans fats, consider a krill oil cap daily 

## 2016-10-09 ENCOUNTER — Encounter: Payer: Self-pay | Admitting: Family Medicine

## 2016-10-09 ENCOUNTER — Ambulatory Visit (INDEPENDENT_AMBULATORY_CARE_PROVIDER_SITE_OTHER): Payer: 59 | Admitting: Family Medicine

## 2016-10-09 VITALS — BP 98/68 | HR 73 | Temp 98.2°F | Wt 131.4 lb

## 2016-10-09 DIAGNOSIS — Z0000 Encounter for general adult medical examination without abnormal findings: Secondary | ICD-10-CM | POA: Diagnosis not present

## 2016-10-09 DIAGNOSIS — E782 Mixed hyperlipidemia: Secondary | ICD-10-CM | POA: Diagnosis not present

## 2016-10-09 DIAGNOSIS — E538 Deficiency of other specified B group vitamins: Secondary | ICD-10-CM

## 2016-10-09 LAB — COMPREHENSIVE METABOLIC PANEL
ALK PHOS: 35 U/L — AB (ref 39–117)
ALT: 12 U/L (ref 0–35)
AST: 19 U/L (ref 0–37)
Albumin: 4.6 g/dL (ref 3.5–5.2)
BUN: 13 mg/dL (ref 6–23)
CHLORIDE: 105 meq/L (ref 96–112)
CO2: 30 mEq/L (ref 19–32)
Calcium: 10 mg/dL (ref 8.4–10.5)
Creatinine, Ser: 0.89 mg/dL (ref 0.40–1.20)
GFR: 69.79 mL/min (ref >60.00)
GLUCOSE: 98 mg/dL (ref 70–99)
POTASSIUM: 4.6 meq/L (ref 3.5–5.1)
SODIUM: 140 meq/L (ref 135–145)
TOTAL PROTEIN: 7.4 g/dL (ref 6.0–8.3)
Total Bilirubin: 0.9 mg/dL (ref 0.2–1.2)

## 2016-10-09 LAB — CBC
HEMATOCRIT: 41 % (ref 36.0–46.0)
Hemoglobin: 13.6 g/dL (ref 12.0–15.0)
MCHC: 33.3 g/dL (ref 30.0–36.0)
MCV: 88.8 fl (ref 78.0–100.0)
PLATELETS: 233 10*3/uL (ref 150.0–400.0)
RBC: 4.62 Mil/uL (ref 3.87–5.11)
RDW: 13.5 % (ref 11.5–15.5)
WBC: 6.7 10*3/uL (ref 4.0–10.5)

## 2016-10-09 LAB — LIPID PANEL
Cholesterol: 206 mg/dL — ABNORMAL HIGH (ref 0–200)
HDL: 64.8 mg/dL (ref >39.00)
LDL CALC: 121 mg/dL — AB (ref 0–99)
NONHDL: 141.51
Total CHOL/HDL Ratio: 3
Triglycerides: 102 mg/dL (ref 0.0–149.0)
VLDL: 20.4 mg/dL (ref 0.0–40.0)

## 2016-10-09 LAB — TSH: TSH: 0.84 u[IU]/mL (ref 0.35–4.50)

## 2016-10-09 LAB — VITAMIN B12: Vitamin B-12: 1073 pg/mL — ABNORMAL HIGH (ref 211–911)

## 2016-10-09 NOTE — Patient Instructions (Signed)
Preventive Care for Adults, Female A healthy lifestyle and preventive care can promote health and wellness. Preventive health guidelines for women include the following key practices.  A routine yearly physical is a good way to check with your health care provider about your health and preventive screening. It is a chance to share any concerns and updates on your health and to receive a thorough exam.  Visit your dentist for a routine exam and preventive care every 6 months. Brush your teeth twice a day and floss once a day. Good oral hygiene prevents tooth decay and gum disease.  The frequency of eye exams is based on your age, health, family medical history, use of contact lenses, and other factors. Follow your health care provider's recommendations for frequency of eye exams.  Eat a healthy diet. Foods like vegetables, fruits, whole grains, low-fat dairy products, and lean protein foods contain the nutrients you need without too many calories. Decrease your intake of foods high in solid fats, added sugars, and salt. Eat the right amount of calories for you.Get information about a proper diet from your health care provider, if necessary.  Regular physical exercise is one of the most important things you can do for your health. Most adults should get at least 150 minutes of moderate-intensity exercise (any activity that increases your heart rate and causes you to sweat) each week. In addition, most adults need muscle-strengthening exercises on 2 or more days a week.  Maintain a healthy weight. The body mass index (BMI) is a screening tool to identify possible weight problems. It provides an estimate of body fat based on height and weight. Your health care provider can find your BMI and can help you achieve or maintain a healthy weight.For adults 20 years and older:  A BMI below 18.5 is considered underweight.  A BMI of 18.5 to 24.9 is normal.  A BMI of 25 to 29.9 is considered  overweight.  A BMI of 30 and above is considered obese.  Maintain normal blood lipids and cholesterol levels by exercising and minimizing your intake of saturated fat. Eat a balanced diet with plenty of fruit and vegetables. Blood tests for lipids and cholesterol should begin at age 64 and be repeated every 5 years. If your lipid or cholesterol levels are high, you are over 50, or you are at high risk for heart disease, you may need your cholesterol levels checked more frequently.Ongoing high lipid and cholesterol levels should be treated with medicines if diet and exercise are not working.  If you smoke, find out from your health care provider how to quit. If you do not use tobacco, do not start.  Lung cancer screening is recommended for adults aged 52-80 years who are at high risk for developing lung cancer because of a history of smoking. A yearly low-dose CT scan of the lungs is recommended for people who have at least a 30-pack-year history of smoking and are a current smoker or have quit within the past 15 years. A pack year of smoking is smoking an average of 1 pack of cigarettes a day for 1 year (for example: 1 pack a day for 30 years or 2 packs a day for 15 years). Yearly screening should continue until the smoker has stopped smoking for at least 15 years. Yearly screening should be stopped for people who develop a health problem that would prevent them from having lung cancer treatment.  If you are pregnant, do not drink alcohol. If you are  breastfeeding, be very cautious about drinking alcohol. If you are not pregnant and choose to drink alcohol, do not have more than 1 drink per day. One drink is considered to be 12 ounces (355 mL) of beer, 5 ounces (148 mL) of wine, or 1.5 ounces (44 mL) of liquor.  Avoid use of street drugs. Do not share needles with anyone. Ask for help if you need support or instructions about stopping the use of drugs.  High blood pressure causes heart disease and  increases the risk of stroke. Your blood pressure should be checked at least every 1 to 2 years. Ongoing high blood pressure should be treated with medicines if weight loss and exercise do not work.  If you are 25-78 years old, ask your health care provider if you should take aspirin to prevent strokes.  Diabetes screening is done by taking a blood sample to check your blood glucose level after you have not eaten for a certain period of time (fasting). If you are not overweight and you do not have risk factors for diabetes, you should be screened once every 3 years starting at age 86. If you are overweight or obese and you are 3-87 years of age, you should be screened for diabetes every year as part of your cardiovascular risk assessment.  Breast cancer screening is essential preventive care for women. You should practice "breast self-awareness." This means understanding the normal appearance and feel of your breasts and may include breast self-examination. Any changes detected, no matter how small, should be reported to a health care provider. Women in their 66s and 30s should have a clinical breast exam (CBE) by a health care provider as part of a regular health exam every 1 to 3 years. After age 43, women should have a CBE every year. Starting at age 37, women should consider having a mammogram (breast X-ray test) every year. Women who have a family history of breast cancer should talk to their health care provider about genetic screening. Women at a high risk of breast cancer should talk to their health care providers about having an MRI and a mammogram every year.  Breast cancer gene (BRCA)-related cancer risk assessment is recommended for women who have family members with BRCA-related cancers. BRCA-related cancers include breast, ovarian, tubal, and peritoneal cancers. Having family members with these cancers may be associated with an increased risk for harmful changes (mutations) in the breast  cancer genes BRCA1 and BRCA2. Results of the assessment will determine the need for genetic counseling and BRCA1 and BRCA2 testing.  Your health care provider may recommend that you be screened regularly for cancer of the pelvic organs (ovaries, uterus, and vagina). This screening involves a pelvic examination, including checking for microscopic changes to the surface of your cervix (Pap test). You may be encouraged to have this screening done every 3 years, beginning at age 78.  For women ages 79-65, health care providers may recommend pelvic exams and Pap testing every 3 years, or they may recommend the Pap and pelvic exam, combined with testing for human papilloma virus (HPV), every 5 years. Some types of HPV increase your risk of cervical cancer. Testing for HPV may also be done on women of any age with unclear Pap test results.  Other health care providers may not recommend any screening for nonpregnant women who are considered low risk for pelvic cancer and who do not have symptoms. Ask your health care provider if a screening pelvic exam is right for  you.  If you have had past treatment for cervical cancer or a condition that could lead to cancer, you need Pap tests and screening for cancer for at least 20 years after your treatment. If Pap tests have been discontinued, your risk factors (such as having a new sexual partner) need to be reassessed to determine if screening should resume. Some women have medical problems that increase the chance of getting cervical cancer. In these cases, your health care provider may recommend more frequent screening and Pap tests.  Colorectal cancer can be detected and often prevented. Most routine colorectal cancer screening begins at the age of 50 years and continues through age 75 years. However, your health care provider may recommend screening at an earlier age if you have risk factors for colon cancer. On a yearly basis, your health care provider may provide  home test kits to check for hidden blood in the stool. Use of a small camera at the end of a tube, to directly examine the colon (sigmoidoscopy or colonoscopy), can detect the earliest forms of colorectal cancer. Talk to your health care provider about this at age 50, when routine screening begins. Direct exam of the colon should be repeated every 5-10 years through age 75 years, unless early forms of precancerous polyps or small growths are found.  People who are at an increased risk for hepatitis B should be screened for this virus. You are considered at high risk for hepatitis B if:  You were born in a country where hepatitis B occurs often. Talk with your health care provider about which countries are considered high risk.  Your parents were born in a high-risk country and you have not received a shot to protect against hepatitis B (hepatitis B vaccine).  You have HIV or AIDS.  You use needles to inject street drugs.  You live with, or have sex with, someone who has hepatitis B.  You get hemodialysis treatment.  You take certain medicines for conditions like cancer, organ transplantation, and autoimmune conditions.  Hepatitis C blood testing is recommended for all people born from 1945 through 1965 and any individual with known risks for hepatitis C.  Practice safe sex. Use condoms and avoid high-risk sexual practices to reduce the spread of sexually transmitted infections (STIs). STIs include gonorrhea, chlamydia, syphilis, trichomonas, herpes, HPV, and human immunodeficiency virus (HIV). Herpes, HIV, and HPV are viral illnesses that have no cure. They can result in disability, cancer, and death.  You should be screened for sexually transmitted illnesses (STIs) including gonorrhea and chlamydia if:  You are sexually active and are younger than 24 years.  You are older than 24 years and your health care provider tells you that you are at risk for this type of infection.  Your sexual  activity has changed since you were last screened and you are at an increased risk for chlamydia or gonorrhea. Ask your health care provider if you are at risk.  If you are at risk of being infected with HIV, it is recommended that you take a prescription medicine daily to prevent HIV infection. This is called preexposure prophylaxis (PrEP). You are considered at risk if:  You are sexually active and do not regularly use condoms or know the HIV status of your partner(s).  You take drugs by injection.  You are sexually active with a partner who has HIV.  Talk with your health care provider about whether you are at high risk of being infected with HIV. If   you choose to begin PrEP, you should first be tested for HIV. You should then be tested every 3 months for as long as you are taking PrEP.  Osteoporosis is a disease in which the bones lose minerals and strength with aging. This can result in serious bone fractures or breaks. The risk of osteoporosis can be identified using a bone density scan. Women ages 1 years and over and women at risk for fractures or osteoporosis should discuss screening with their health care providers. Ask your health care provider whether you should take a calcium supplement or vitamin D to reduce the rate of osteoporosis.  Menopause can be associated with physical symptoms and risks. Hormone replacement therapy is available to decrease symptoms and risks. You should talk to your health care provider about whether hormone replacement therapy is right for you.  Use sunscreen. Apply sunscreen liberally and repeatedly throughout the day. You should seek shade when your shadow is shorter than you. Protect yourself by wearing long sleeves, pants, a wide-brimmed hat, and sunglasses year round, whenever you are outdoors.  Once a month, do a whole body skin exam, using a mirror to look at the skin on your back. Tell your health care provider of new moles, moles that have irregular  borders, moles that are larger than a pencil eraser, or moles that have changed in shape or color.  Stay current with required vaccines (immunizations).  Influenza vaccine. All adults should be immunized every year.  Tetanus, diphtheria, and acellular pertussis (Td, Tdap) vaccine. Pregnant women should receive 1 dose of Tdap vaccine during each pregnancy. The dose should be obtained regardless of the length of time since the last dose. Immunization is preferred during the 27th-36th week of gestation. An adult who has not previously received Tdap or who does not know her vaccine status should receive 1 dose of Tdap. This initial dose should be followed by tetanus and diphtheria toxoids (Td) booster doses every 10 years. Adults with an unknown or incomplete history of completing a 3-dose immunization series with Td-containing vaccines should begin or complete a primary immunization series including a Tdap dose. Adults should receive a Td booster every 10 years.  Varicella vaccine. An adult without evidence of immunity to varicella should receive 2 doses or a second dose if she has previously received 1 dose. Pregnant females who do not have evidence of immunity should receive the first dose after pregnancy. This first dose should be obtained before leaving the health care facility. The second dose should be obtained 4-8 weeks after the first dose.  Human papillomavirus (HPV) vaccine. Females aged 13-26 years who have not received the vaccine previously should obtain the 3-dose series. The vaccine is not recommended for use in pregnant females. However, pregnancy testing is not needed before receiving a dose. If a female is found to be pregnant after receiving a dose, no treatment is needed. In that case, the remaining doses should be delayed until after the pregnancy. Immunization is recommended for any person with an immunocompromised condition through the age of 24 years if she did not get any or all doses  earlier. During the 3-dose series, the second dose should be obtained 4-8 weeks after the first dose. The third dose should be obtained 24 weeks after the first dose and 16 weeks after the second dose.  Zoster vaccine. One dose is recommended for adults aged 97 years or older unless certain conditions are present.  Measles, mumps, and rubella (MMR) vaccine. Adults born  before 1957 generally are considered immune to measles and mumps. Adults born in [birth year] or later should have 1 or more doses of MMR vaccine unless there is a contraindication to the vaccine or there is laboratory evidence of immunity to each of the three diseases. A routine second dose of MMR vaccine should be obtained at least 28 days after the first dose for students attending postsecondary schools, health care workers, or international travelers. People who received inactivated measles vaccine or an unknown type of measles vaccine during 1963-1967 should receive 2 doses of MMR vaccine. People who received inactivated mumps vaccine or an unknown type of mumps vaccine before 1979 and are at high risk for mumps infection should consider immunization with 2 doses of MMR vaccine. For females of childbearing age, rubella immunity should be determined. If there is no evidence of immunity, females who are not pregnant should be vaccinated. If there is no evidence of immunity, females who are pregnant should delay immunization until after pregnancy. Unvaccinated health care workers born before 60 who lack laboratory evidence of measles, mumps, or rubella immunity or laboratory confirmation of disease should consider measles and mumps immunization with 2 doses of MMR vaccine or rubella immunization with 1 dose of MMR vaccine.  Pneumococcal 13-valent conjugate (PCV13) vaccine. When indicated, a person who is uncertain of his immunization history and has no record of immunization should receive the PCV13 vaccine. All adults 61 years of age and older  should receive this vaccine. An adult aged 92 years or older who has certain medical conditions and has not been previously immunized should receive 1 dose of PCV13 vaccine. This PCV13 should be followed with a dose of pneumococcal polysaccharide (PPSV23) vaccine. Adults who are at high risk for pneumococcal disease should obtain the PPSV23 vaccine at least 8 weeks after the dose of PCV13 vaccine. Adults older than 55 years of age who have normal immune system function should obtain the PPSV23 vaccine dose at least 1 year after the dose of PCV13 vaccine.  Pneumococcal polysaccharide (PPSV23) vaccine. When PCV13 is also indicated, PCV13 should be obtained first. All adults aged 2 years and older should be immunized. An adult younger than age 30 years who has certain medical conditions should be immunized. Any person who resides in a nursing home or long-term care facility should be immunized. An adult smoker should be immunized. People with an immunocompromised condition and certain other conditions should receive both PCV13 and PPSV23 vaccines. People with human immunodeficiency virus (HIV) infection should be immunized as soon as possible after diagnosis. Immunization during chemotherapy or radiation therapy should be avoided. Routine use of PPSV23 vaccine is not recommended for American Indians, [place] Natives, or people younger than 65 years unless there are medical conditions that require PPSV23 vaccine. When indicated, people who have unknown immunization and have no record of immunization should receive PPSV23 vaccine. One-time revaccination 5 years after the first dose of PPSV23 is recommended for people aged 19-64 years who have chronic kidney failure, nephrotic syndrome, asplenia, or immunocompromised conditions. People who received 1-2 doses of PPSV23 before age 44 years should receive another dose of PPSV23 vaccine at age 83 years or later if at least 5 years have passed since the previous dose. Doses  of PPSV23 are not needed for people immunized with PPSV23 at or after age 20 years.  Meningococcal vaccine. Adults with asplenia or persistent complement component deficiencies should receive 2 doses of quadrivalent meningococcal conjugate (MenACWY-D) vaccine. The doses should be obtained  at least 2 months apart. Microbiologists working with certain meningococcal bacteria, [place] recruits, people at risk during an outbreak, and people who travel to or live in countries with a high rate of meningitis should be immunized. A first-year college student up through age 28 years who is living in a residence hall should receive a dose if she did not receive a dose on or after her 16th birthday. Adults who have certain high-risk conditions should receive one or more doses of vaccine.  Hepatitis A vaccine. Adults who wish to be protected from this disease, have certain high-risk conditions, work with hepatitis A-infected animals, work in hepatitis A research labs, or travel to or work in countries with a high rate of hepatitis A should be immunized. Adults who were previously unvaccinated and who anticipate close contact with an international adoptee during the first 60 days after arrival in the [place] from a country with a high rate of hepatitis A should be immunized.  Hepatitis B vaccine. Adults who wish to be protected from this disease, have certain high-risk conditions, may be exposed to blood or other infectious body fluids, are household contacts or sex partners of hepatitis B positive people, are clients or workers in certain care facilities, or travel to or work in countries with a high rate of hepatitis B should be immunized.  Haemophilus influenzae type b (Hib) vaccine. A previously unvaccinated person with asplenia or sickle cell disease or having a scheduled splenectomy should receive 1 dose of Hib vaccine. Regardless of previous immunization, a recipient of a hematopoietic stem cell transplant  should receive a 3-dose series 6-12 months after her successful transplant. Hib vaccine is not recommended for adults with HIV infection. Preventive Services / Frequency Ages 71 to 87 years  Blood pressure check.** / Every 3-5 years.  Lipid and cholesterol check.** / Every 5 years beginning at age 1.  Clinical breast exam.** / Every 3 years for women in their 3s and 31s.  BRCA-related cancer risk assessment.** / For women who have family members with a BRCA-related cancer (breast, ovarian, tubal, or peritoneal cancers).  Pap test.** / Every 2 years from ages 50 through 86. Every 3 years starting at age 87 through age 7 or 75 with a history of 3 consecutive normal Pap tests.  HPV screening.** / Every 3 years from ages 59 through ages 35 to 6 with a history of 3 consecutive normal Pap tests.  Hepatitis C blood test.** / For any individual with known risks for hepatitis C.  Skin self-exam. / Monthly.  Influenza vaccine. / Every year.  Tetanus, diphtheria, and acellular pertussis (Tdap, Td) vaccine.** / Consult your health care provider. Pregnant women should receive 1 dose of Tdap vaccine during each pregnancy. 1 dose of Td every 10 years.  Varicella vaccine.** / Consult your health care provider. Pregnant females who do not have evidence of immunity should receive the first dose after pregnancy.  HPV vaccine. / 3 doses over 6 months, if 72 and younger. The vaccine is not recommended for use in pregnant females. However, pregnancy testing is not needed before receiving a dose.  Measles, mumps, rubella (MMR) vaccine.** / You need at least 1 dose of MMR if you were born in 1957 or later. You may also need a 2nd dose. For females of childbearing age, rubella immunity should be determined. If there is no evidence of immunity, females who are not pregnant should be vaccinated. If there is no evidence of immunity, females who are  pregnant should delay immunization until after  pregnancy.  Pneumococcal 13-valent conjugate (PCV13) vaccine.** / Consult your health care provider.  Pneumococcal polysaccharide (PPSV23) vaccine.** / 1 to 2 doses if you smoke cigarettes or if you have certain conditions.  Meningococcal vaccine.** / 1 dose if you are age 87 to 44 years and a Market researcher living in a residence hall, or have one of several medical conditions, you need to get vaccinated against meningococcal disease. You may also need additional booster doses.  Hepatitis A vaccine.** / Consult your health care provider.  Hepatitis B vaccine.** / Consult your health care provider.  Haemophilus influenzae type b (Hib) vaccine.** / Consult your health care provider. Ages 86 to 38 years  Blood pressure check.** / Every year.  Lipid and cholesterol check.** / Every 5 years beginning at age 49 years.  Lung cancer screening. / Every year if you are aged 71-80 years and have a 30-pack-year history of smoking and currently smoke or have quit within the past 15 years. Yearly screening is stopped once you have quit smoking for at least 15 years or develop a health problem that would prevent you from having lung cancer treatment.  Clinical breast exam.** / Every year after age 51 years.  BRCA-related cancer risk assessment.** / For women who have family members with a BRCA-related cancer (breast, ovarian, tubal, or peritoneal cancers).  Mammogram.** / Every year beginning at age 18 years and continuing for as long as you are in good health. Consult with your health care provider.  Pap test.** / Every 3 years starting at age 63 years through age 37 or 57 years with a history of 3 consecutive normal Pap tests.  HPV screening.** / Every 3 years from ages 41 years through ages 76 to 23 years with a history of 3 consecutive normal Pap tests.  Fecal occult blood test (FOBT) of stool. / Every year beginning at age 36 years and continuing until age 51 years. You may not need  to do this test if you get a colonoscopy every 10 years.  Flexible sigmoidoscopy or colonoscopy.** / Every 5 years for a flexible sigmoidoscopy or every 10 years for a colonoscopy beginning at age 36 years and continuing until age 35 years.  Hepatitis C blood test.** / For all people born from [birth year] through 1965 and any individual with known risks for hepatitis C.  Skin self-exam. / Monthly.  Influenza vaccine. / Every year.  Tetanus, diphtheria, and acellular pertussis (Tdap/Td) vaccine.** / Consult your health care provider. Pregnant women should receive 1 dose of Tdap vaccine during each pregnancy. 1 dose of Td every 10 years.  Varicella vaccine.** / Consult your health care provider. Pregnant females who do not have evidence of immunity should receive the first dose after pregnancy.  Zoster vaccine.** / 1 dose for adults aged 73 years or older.  Measles, mumps, rubella (MMR) vaccine.** / You need at least 1 dose of MMR if you were born in 1957 or later. You may also need a second dose. For females of childbearing age, rubella immunity should be determined. If there is no evidence of immunity, females who are not pregnant should be vaccinated. If there is no evidence of immunity, females who are pregnant should delay immunization until after pregnancy.  Pneumococcal 13-valent conjugate (PCV13) vaccine.** / Consult your health care provider.  Pneumococcal polysaccharide (PPSV23) vaccine.** / 1 to 2 doses if you smoke cigarettes or if you have certain conditions.  Meningococcal vaccine.** /  Consult your health care provider.  Hepatitis A vaccine.** / Consult your health care provider.  Hepatitis B vaccine.** / Consult your health care provider.  Haemophilus influenzae type b (Hib) vaccine.** / Consult your health care provider. Ages 80 years and over  Blood pressure check.** / Every year.  Lipid and cholesterol check.** / Every 5 years beginning at age 62 years.  Lung cancer  screening. / Every year if you are aged 32-80 years and have a 30-pack-year history of smoking and currently smoke or have quit within the past 15 years. Yearly screening is stopped once you have quit smoking for at least 15 years or develop a health problem that would prevent you from having lung cancer treatment.  Clinical breast exam.** / Every year after age 61 years.  BRCA-related cancer risk assessment.** / For women who have family members with a BRCA-related cancer (breast, ovarian, tubal, or peritoneal cancers).  Mammogram.** / Every year beginning at age 39 years and continuing for as long as you are in good health. Consult with your health care provider.  Pap test.** / Every 3 years starting at age 85 years through age 74 or 72 years with 3 consecutive normal Pap tests. Testing can be stopped between 65 and 70 years with 3 consecutive normal Pap tests and no abnormal Pap or HPV tests in the past 10 years.  HPV screening.** / Every 3 years from ages 55 years through ages 67 or 77 years with a history of 3 consecutive normal Pap tests. Testing can be stopped between 65 and 70 years with 3 consecutive normal Pap tests and no abnormal Pap or HPV tests in the past 10 years.  Fecal occult blood test (FOBT) of stool. / Every year beginning at age 81 years and continuing until age 22 years. You may not need to do this test if you get a colonoscopy every 10 years.  Flexible sigmoidoscopy or colonoscopy.** / Every 5 years for a flexible sigmoidoscopy or every 10 years for a colonoscopy beginning at age 67 years and continuing until age 22 years.  Hepatitis C blood test.** / For all people born from [birth year] through 1965 and any individual with known risks for hepatitis C.  Osteoporosis screening.** / A one-time screening for women ages 8 years and over and women at risk for fractures or osteoporosis.  Skin self-exam. / Monthly.  Influenza vaccine. / Every year.  Tetanus, diphtheria, and  acellular pertussis (Tdap/Td) vaccine.** / 1 dose of Td every 10 years.  Varicella vaccine.** / Consult your health care provider.  Zoster vaccine.** / 1 dose for adults aged 56 years or older.  Pneumococcal 13-valent conjugate (PCV13) vaccine.** / Consult your health care provider.  Pneumococcal polysaccharide (PPSV23) vaccine.** / 1 dose for all adults aged 15 years and older.  Meningococcal vaccine.** / Consult your health care provider.  Hepatitis A vaccine.** / Consult your health care provider.  Hepatitis B vaccine.** / Consult your health care provider.  Haemophilus influenzae type b (Hib) vaccine.** / Consult your health care provider. ** Family history and personal history of risk and conditions may change your health care provider's recommendations.   This information is not intended to replace advice given to you by your health care provider. Make sure you discuss any questions you have with your health care provider.   Document Released: 01/20/2002 Document Revised: 12/15/2014 Document Reviewed: 04/21/2011 Elsevier Interactive Patient Education [employer].

## 2016-10-09 NOTE — Progress Notes (Addendum)
Patient ID: Dawn Burch, female   DOB: [birth year], 55 y.o.   MRN: [id number]   Subjective:    Patient ID: Dawn Burch, female    DOB: [birth year], 55 y.o.   MRN: [id number]  Chief Complaint  Patient presents with  . Annual Exam    HPI Patient is in today for an annual exam. She is doing well although she does struggle with b/l shoulder pain and right hand pain as well. No recent illness or hospitalizations. Denies CP/palp/SOB/HA/congestion/fevers/GI or GU c/o. Taking meds as prescribed  Past Medical History:  Diagnosis Date  . Allergic state 05/07/2014  . BCC (basal cell carcinoma of skin)    basal- under R eye   . Cancer ([place]) 20's   basal- under R eye  . Chicken pox as a child  . 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 Fibrocystic breast 05/07/2014  . Hyperlipidemia, mixed 10/21/2015    Past Surgical History:  Procedure Laterality Date  . BREAST SURGERY  14 yrs ago   biopsy- left breast  . HERNIA REPAIR  2010  . SKIN SURGERY  20's   basal cell removal around right eye    Family History  Problem Relation Age of Onset  . Heart Problems Mother     palpitations  . Cancer Mother 73    breast- left  . Diabetes Mother     type 2  . Cancer Maternal Grandmother 36    breast- left, skin on face, colon in 69s or 11s  . Glaucoma Maternal Grandfather   . Other Son     recurrent concussion  . Diabetes Son     Social History   Social History  . Marital status: Married    Spouse name: N/A  . Number of children: N/A  . Years of education: N/A   Occupational History  . Not on file.   Social History Main Topics  . Smoking status: Never Smoker  . Smokeless tobacco: Never Used  . Alcohol use Yes     Comment: occasionally  . Drug use: No  . Sexual activity: Yes    Partners: Male     Comment: minimizes wheat. lives with husband, son   Other Topics Concern  . Not on file   Social History Narrative  . No narrative on file    No outpatient prescriptions prior to visit.    No facility-administered medications prior to visit.     Allergies  Allergen Reactions  . Penicillins     Review of Systems  Constitutional: Negative for chills, fever and malaise/fatigue.  HENT: Negative for congestion and hearing loss.   Eyes: Negative for blurred vision and discharge.  Respiratory: Negative for cough, sputum production and shortness of breath.   Cardiovascular: Negative for chest pain, palpitations and leg swelling.  Gastrointestinal: Negative for abdominal pain, blood in stool, constipation, diarrhea, heartburn, nausea and vomiting.  Genitourinary: Negative for dysuria, frequency, hematuria and urgency.  Musculoskeletal: Positive for joint pain. Negative for back pain, falls and myalgias.  Skin: Negative for rash.  Neurological: Negative for dizziness, sensory change, loss of consciousness, weakness and headaches.  Endo/Heme/Allergies: Negative for environmental allergies. Does not bruise/bleed easily.  Psychiatric/Behavioral: Negative for depression and suicidal ideas. The patient is not nervous/anxious and does not have insomnia.        Objective:    Physical Exam  Constitutional: She is oriented to person, place, and time. She appears well-developed and well-nourished. No distress.  HENT:  Head: Normocephalic and atraumatic.  Eyes: Conjunctivae  are normal.  Neck: Normal range of motion. Neck supple. No thyromegaly present.  Cardiovascular: Normal rate, regular rhythm and normal heart sounds.   No murmur heard. Pulmonary/Chest: Effort normal and breath sounds normal. No respiratory distress. She has no wheezes.  Abdominal: Soft. Bowel sounds are normal. She exhibits no distension and no mass. There is no tenderness.  Musculoskeletal: Normal range of motion. She exhibits no edema or deformity.  Lymphadenopathy:    She has no cervical adenopathy.  Neurological: She is alert and oriented to person, place, and time.  Skin: Skin is warm and dry. She is  not diaphoretic.  Psychiatric: She has a normal mood and affect. Her behavior is normal.    BP 98/68 (BP Location: Left Arm, Patient Position: Sitting, Cuff Size: Normal)   Pulse 73   Temp 98.2 F (36.8 C) (Oral)   Wt 131 lb 6.4 oz (59.6 kg)   SpO2 97%   BMI 20.28 kg/m  Wt Readings from Last 3 Encounters:  10/09/16 131 lb 6.4 oz (59.6 kg)  10/09/15 133 lb 4 oz (60.4 kg)  02/02/15 133 lb 6 oz (60.5 kg)     Lab Results  Component Value Date   WBC 6.7 10/09/2016   HGB 13.6 10/09/2016   HCT 41.0 10/09/2016   PLT 233.0 10/09/2016   GLUCOSE 98 10/09/2016   CHOL 206 (H) 10/09/2016   TRIG 102.0 10/09/2016   HDL 64.80 10/09/2016   LDLCALC 121 (H) 10/09/2016   ALT 12 10/09/2016   AST 19 10/09/2016   NA 140 10/09/2016   K 4.6 10/09/2016   CL 105 10/09/2016   CREATININE 0.89 10/09/2016   BUN 13 10/09/2016   CO2 30 10/09/2016   TSH 0.84 10/09/2016    Lab Results  Component Value Date   TSH 0.84 10/09/2016   Lab Results  Component Value Date   WBC 6.7 10/09/2016   HGB 13.6 10/09/2016   HCT 41.0 10/09/2016   MCV 88.8 10/09/2016   PLT 233.0 10/09/2016   Lab Results  Component Value Date   NA 140 10/09/2016   K 4.6 10/09/2016   CO2 30 10/09/2016   GLUCOSE 98 10/09/2016   BUN 13 10/09/2016   CREATININE 0.89 10/09/2016   BILITOT 0.9 10/09/2016   ALKPHOS 35 (L) 10/09/2016   AST 19 10/09/2016   ALT 12 10/09/2016   PROT 7.4 10/09/2016   ALBUMIN 4.6 10/09/2016   CALCIUM 10.0 10/09/2016   GFR 69.79 10/09/2016   Lab Results  Component Value Date   CHOL 206 (H) 10/09/2016   Lab Results  Component Value Date   HDL 64.80 10/09/2016   Lab Results  Component Value Date   LDLCALC 121 (H) 10/09/2016   Lab Results  Component Value Date   TRIG 102.0 10/09/2016   Lab Results  Component Value Date   CHOLHDL 3 10/09/2016   No results found for: HGBA1C     Assessment & Plan:   Problem List Items Addressed This Visit    Vitamin B12 deficiency    Mildly high  level. Encouraged to decrease supplementation.       Relevant Orders   Vitamin B12 (Completed)   Preventative health care    Patient encouraged to maintain heart healthy diet, regular exercise, adequate sleep. Consider daily probiotics. Take medications as prescribed. Has ACP documents agrees to bring [place] a copy      Relevant Orders   Comprehensive metabolic panel (Completed)   TSH (Completed)   CBC (Completed)  Hyperlipidemia, mixed - Primary    Encouraged heart healthy diet, increase exercise, avoid trans fats, consider a krill oil cap daily      Relevant Orders   Lipid panel (Completed)      Ms. Buffa does not currently have medications on file.  No orders of the defined types were placed in this encounter.    Penni Homans, MD

## 2016-10-09 NOTE — Progress Notes (Signed)
Pre visit review using our clinic review tool, if applicable. No additional management support is needed unless otherwise documented below in the visit note. 

## 2016-10-09 NOTE — Assessment & Plan Note (Signed)
Patient encouraged to maintain heart healthy diet, regular exercise, adequate sleep. Consider daily probiotics. Take medications as prescribed. Has ACP documents agrees to bring [place] a copy

## 2016-10-12 NOTE — Assessment & Plan Note (Signed)
Mildly high level. Encouraged to decrease supplementation.

## 2016-10-12 NOTE — Assessment & Plan Note (Signed)
Encouraged heart healthy diet, increase exercise, avoid trans fats, consider a krill oil cap daily 

## 2017-03-16 IMAGING — MG MM DIGITAL SCREENING BILAT W/ TOMO W/ CAD
12 series · 12 of 28 positions shown · non-contrast
Comparison: Previous exam(s).

CLINICAL DATA: Screening.

EXAM:
DIGITAL SCREENING BILATERAL MAMMOGRAM WITH 3D TOMO WITH CAD

[R CC]
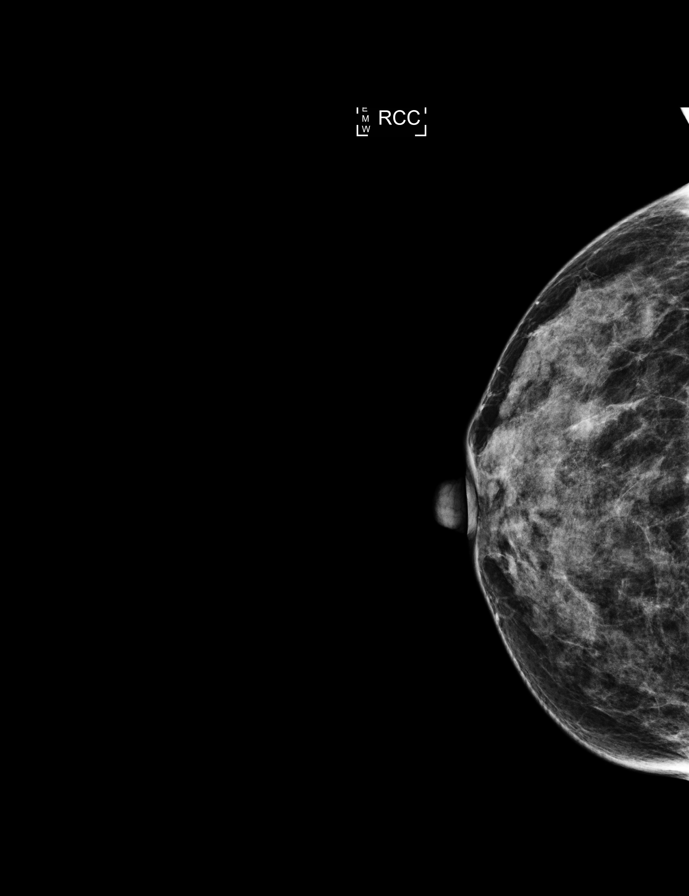

[L MLO]
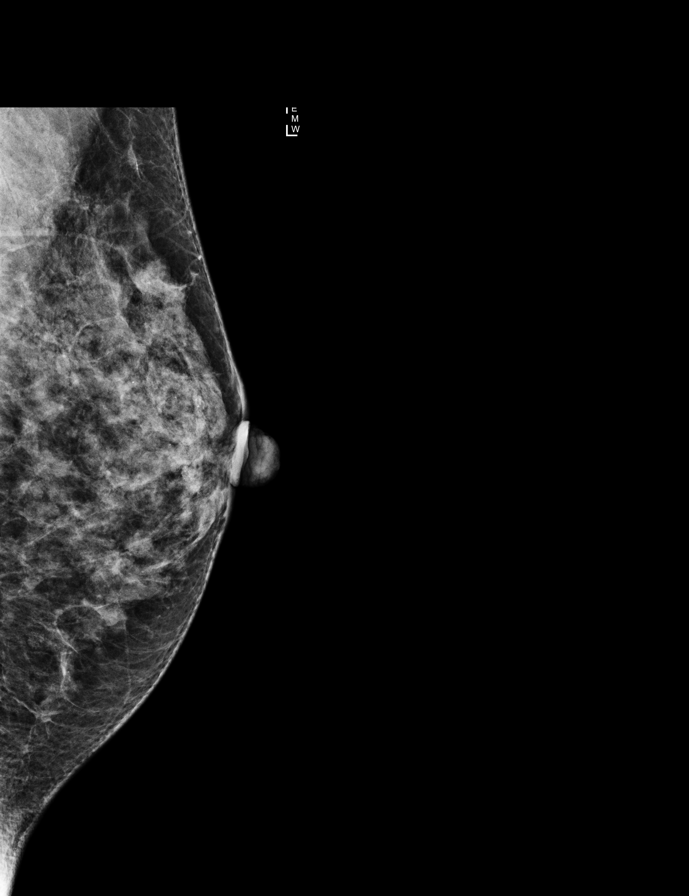

[L CC]
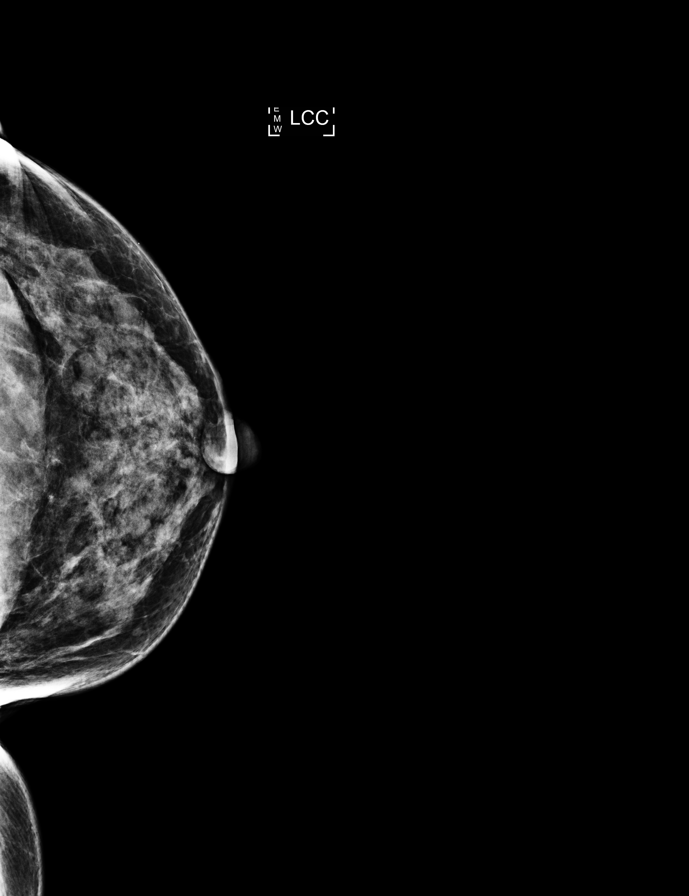

[R MLO]
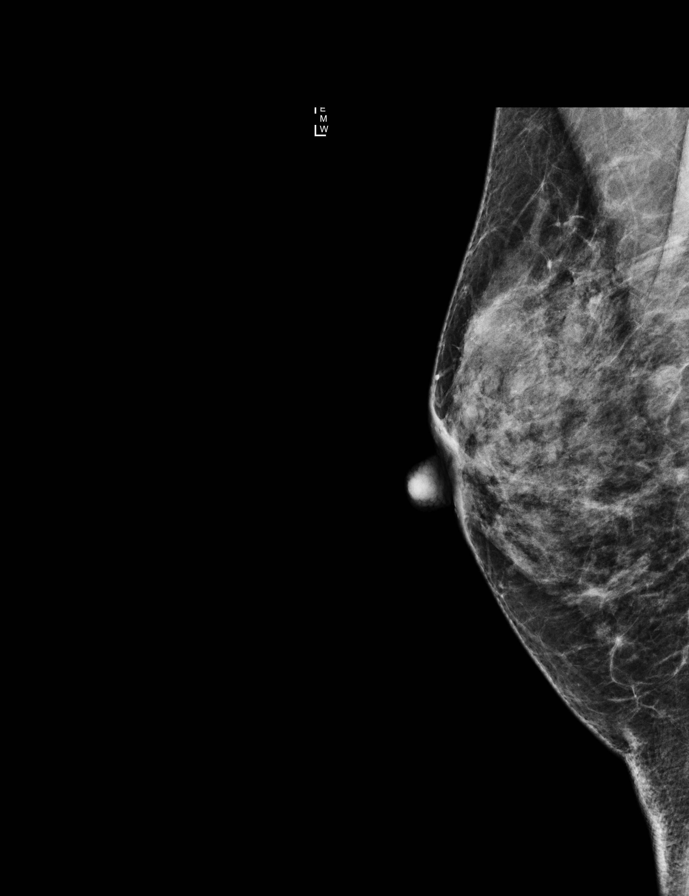

[L CC tomo (1 of 2)]
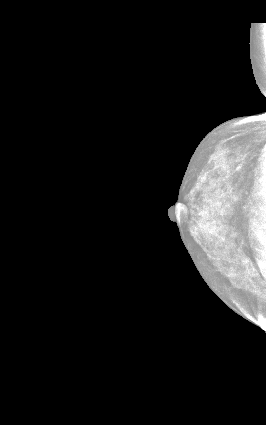

[L MLO tomo (1 of 2)]
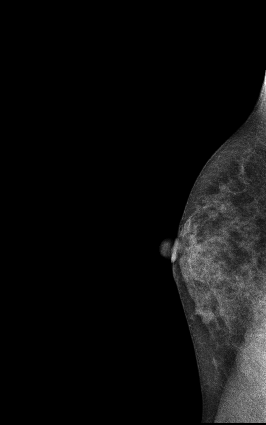

[R CC tomo (1 of 2)]
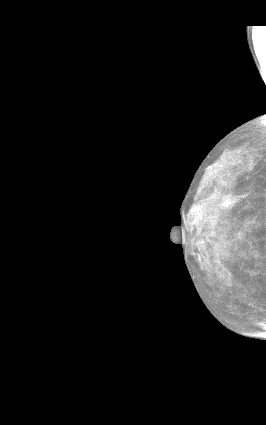

[R MLO tomo (1 of 2)]
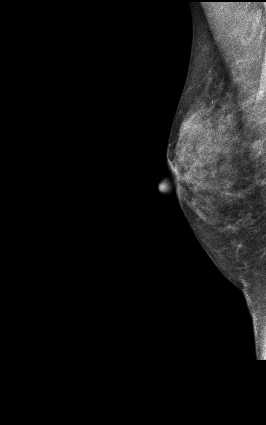

[L CC tomo (2 of 2) · tomo slice 25/49.0]
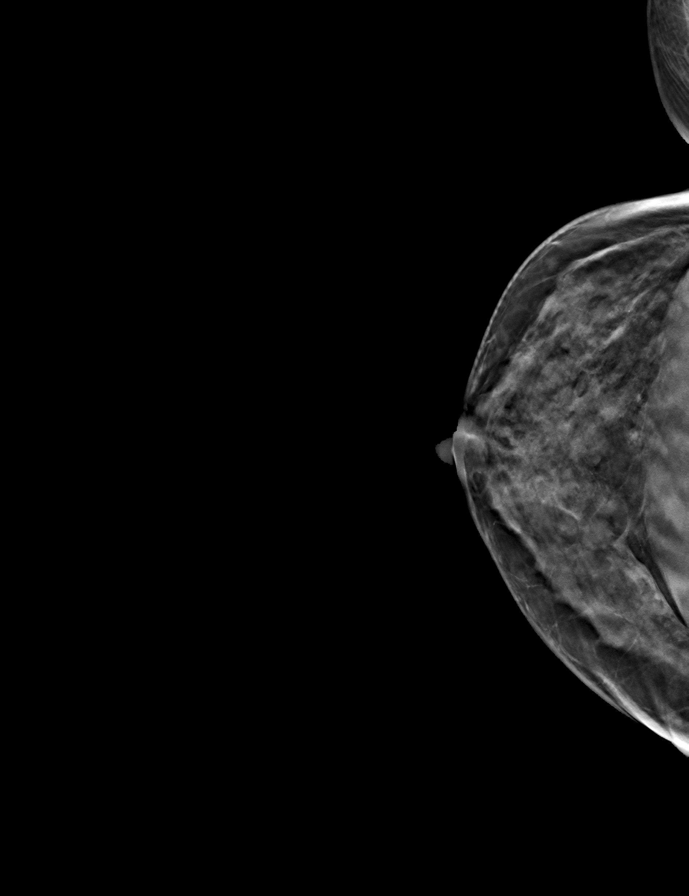

[R CC tomo (2 of 2) · tomo slice 20/39.0]
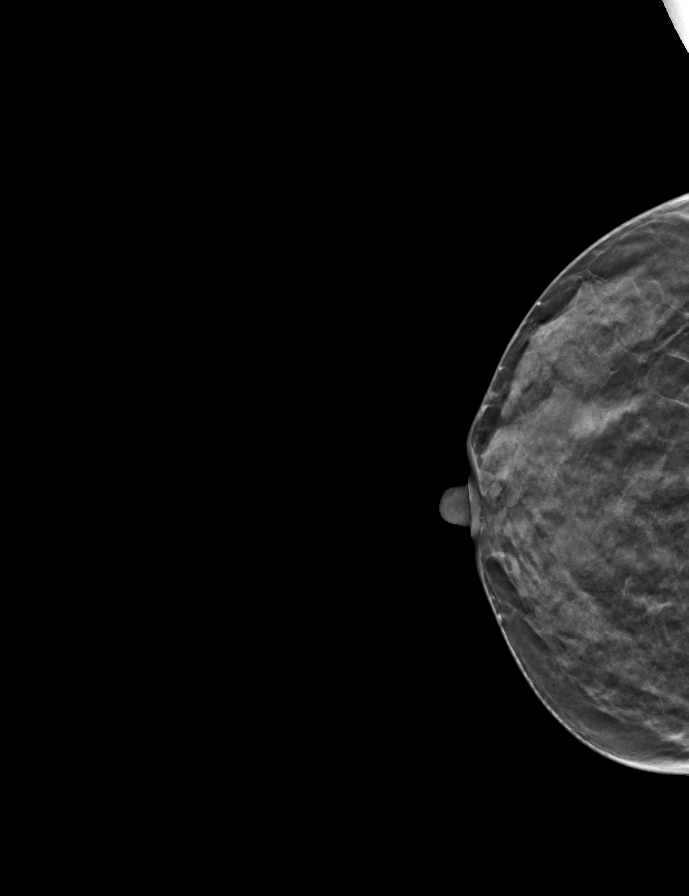

[R MLO tomo (2 of 2) · tomo slice 19/38.0]
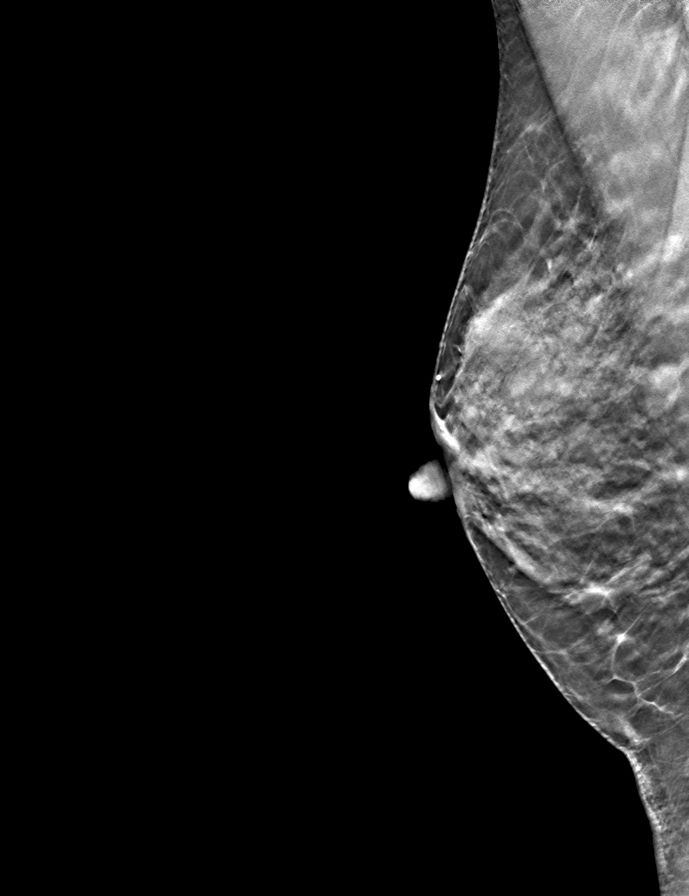

[L MLO tomo (2 of 2) · tomo slice 19/37.0]
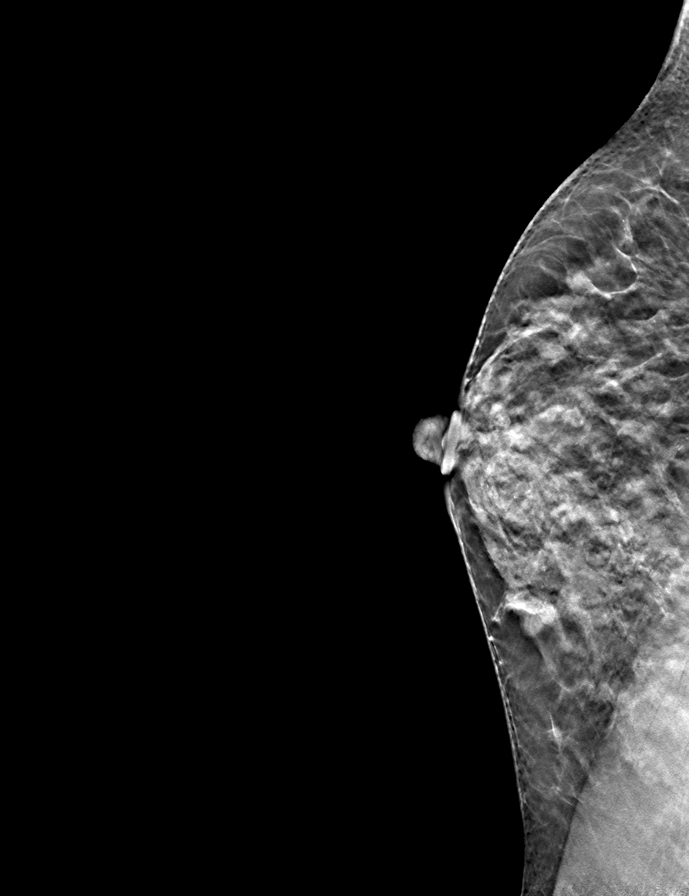

[12 of 28 positions shown; findings below may reference images not displayed]

ACR Breast Density Category c: The breast tissue is heterogeneously
dense, which may obscure small masses.
FINDINGS: There are no findings suspicious for malignancy. Images were
processed with CAD.
IMPRESSION: No mammographic evidence of malignancy. A result letter of this
screening mammogram will be mailed directly to the patient.

RECOMMENDATION:
Screening mammogram in one year. (Code:OA-G-1SS)

BI-RADS CATEGORY  1: Negative.

## 2017-10-12 ENCOUNTER — Ambulatory Visit (INDEPENDENT_AMBULATORY_CARE_PROVIDER_SITE_OTHER): Payer: 59 | Admitting: Family Medicine

## 2017-10-12 ENCOUNTER — Encounter: Payer: Self-pay | Admitting: Family Medicine

## 2017-10-12 DIAGNOSIS — C4491 Basal cell carcinoma of skin, unspecified: Secondary | ICD-10-CM | POA: Diagnosis not present

## 2017-10-12 DIAGNOSIS — M722 Plantar fascial fibromatosis: Secondary | ICD-10-CM | POA: Diagnosis not present

## 2017-10-12 DIAGNOSIS — E538 Deficiency of other specified B group vitamins: Secondary | ICD-10-CM

## 2017-10-12 DIAGNOSIS — Z0000 Encounter for general adult medical examination without abnormal findings: Secondary | ICD-10-CM | POA: Diagnosis not present

## 2017-10-12 DIAGNOSIS — E782 Mixed hyperlipidemia: Secondary | ICD-10-CM

## 2017-10-12 HISTORY — DX: Plantar fascial fibromatosis: M72.2

## 2017-10-12 LAB — CBC
HCT: 40.1 % (ref 36.0–46.0)
Hemoglobin: 13.1 g/dL (ref 12.0–15.0)
MCHC: 32.6 g/dL (ref 30.0–36.0)
MCV: 90.9 fl (ref 78.0–100.0)
Platelets: 262 10*3/uL (ref 150.0–400.0)
RBC: 4.42 Mil/uL (ref 3.87–5.11)
RDW: 13.8 % (ref 11.5–15.5)
WBC: 5.7 10*3/uL (ref 4.0–10.5)

## 2017-10-12 LAB — COMPREHENSIVE METABOLIC PANEL
ALT: 11 U/L (ref 0–35)
AST: 15 U/L (ref 0–37)
Albumin: 4.4 g/dL (ref 3.5–5.2)
Alkaline Phosphatase: 40 U/L (ref 39–117)
BILIRUBIN TOTAL: 0.7 mg/dL (ref 0.2–1.2)
BUN: 12 mg/dL (ref 6–23)
CHLORIDE: 102 meq/L (ref 96–112)
CO2: 31 meq/L (ref 19–32)
CREATININE: 0.88 mg/dL (ref 0.40–1.20)
Calcium: 9.9 mg/dL (ref 8.4–10.5)
GFR: 70.45 mL/min (ref >60.00)
GLUCOSE: 93 mg/dL (ref 70–99)
Potassium: 3.9 mEq/L (ref 3.5–5.1)
Sodium: 139 mEq/L (ref 135–145)
Total Protein: 6.8 g/dL (ref 6.0–8.3)

## 2017-10-12 LAB — TSH: TSH: 0.78 u[IU]/mL (ref 0.35–4.50)

## 2017-10-12 LAB — LIPID PANEL
CHOL/HDL RATIO: 3
Cholesterol: 186 mg/dL (ref 0–200)
HDL: 55.6 mg/dL (ref >39.00)
LDL CALC: 114 mg/dL — AB (ref 0–99)
NONHDL: 130.69
TRIGLYCERIDES: 85 mg/dL (ref 0.0–149.0)
VLDL: 17 mg/dL (ref 0.0–40.0)

## 2017-10-12 LAB — VITAMIN B12: Vitamin B-12: 622 pg/mL (ref 211–911)

## 2017-10-12 NOTE — Progress Notes (Signed)
Subjective:  I acted as a Education administrator for [employer]. [name], [place]   Patient ID: Dawn Burch, female    DOB: 02/14/1961, 56 y.o.   MRN: [id number]  No chief complaint on file.   HPI  Patient is in today for annual exam. She is following up on her hyperlipidemia, and other medical concerns. No recent febrile illness or acute hospitalizations. Denies CP/palp/SOB/HA/congestion/fevers/GI or GU c/o. Taking meds as prescribed. She is staying active and maintaining a heart healthy diet. She is struggling with b/l heel pain worse upon first arising. No injury or trauma.  Patient Care Team: Dawn Lukes, MD as PCP - General (Family Medicine)   Past Medical History:  Diagnosis Date  . Allergic state 05/07/2014  . BCC (basal cell carcinoma of skin)    basal- under R eye   . Cancer ([place]) 20's   basal- under R eye  . Chicken pox as a child  . 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 Fibrocystic breast 05/07/2014  . Hyperlipidemia, mixed 10/21/2015    Past Surgical History:  Procedure Laterality Date  . BREAST SURGERY  14 yrs ago   biopsy- left breast  . HERNIA REPAIR  2010  . SKIN SURGERY  20's   basal cell removal around right eye    Family History  Problem Relation Age of Onset  . Heart Problems Mother        palpitations  . Cancer Mother 3       breast- left  . Diabetes Mother        type 2  . Cancer Maternal Grandmother 56       breast- left, skin on face, colon in 50s or 7s  . Glaucoma Maternal Grandfather   . Other Son        recurrent concussion  . Diabetes Son     Social History   Socioeconomic History  . Marital status: Married    Spouse name: Not on file  . Number of children: Not on file  . Years of education: Not on file  . Highest education level: Not on file  Social Needs  . Financial resource strain: Not on file  . Food insecurity - worry: Not on file  . Food insecurity - inability: Not on file  . Transportation needs - medical: Not on file  . Transportation  needs - non-medical: Not on file  Occupational History  . Not on file  Tobacco Use  . Smoking status: Never Smoker  . Smokeless tobacco: Never Used  Substance and Sexual Activity  . Alcohol use: Yes    Comment: occasionally  . Drug use: No  . Sexual activity: Yes    Partners: Male    Comment: minimizes wheat. lives with husband, son  Other Topics Concern  . Not on file  Social History Narrative  . Not on file    No outpatient medications prior to visit.   No facility-administered medications prior to visit.     Allergies  Allergen Reactions  . Penicillins     Review of Systems  Constitutional: Negative for fever and malaise/fatigue.  HENT: Negative for congestion.   Eyes: Negative for blurred vision.  Respiratory: Negative for cough and shortness of breath.   Cardiovascular: Negative for chest pain, palpitations and leg swelling.  Gastrointestinal: Negative for vomiting.  Musculoskeletal: Positive for joint pain. Negative for back pain.  Skin: Negative for rash.  Neurological: Negative for loss of consciousness and headaches.       Objective:  Physical Exam  Constitutional: She is oriented to person, place, and time. She appears well-developed and well-nourished. No distress.  HENT:  Head: Normocephalic and atraumatic.  Eyes: Conjunctivae are normal.  Neck: Normal range of motion. No thyromegaly present.  Cardiovascular: Normal rate and regular rhythm.  Pulmonary/Chest: Effort normal and breath sounds normal. She has no wheezes.  Abdominal: Soft. Bowel sounds are normal. There is no tenderness.  Musculoskeletal: Normal range of motion. She exhibits no edema or deformity.  Neurological: She is alert and oriented to person, place, and time.  Skin: Skin is warm and dry. She is not diaphoretic.  Psychiatric: She has a normal mood and affect.    There were no vitals taken for this visit. Wt Readings from Last 3 Encounters:  10/09/16 131 lb 6.4 oz (59.6 kg)    10/09/15 133 lb 4 oz (60.4 kg)  02/02/15 133 lb 6 oz (60.5 kg)   BP Readings from Last 3 Encounters:  10/09/16 98/68  10/09/15 110/62  02/02/15 102/62      There is no immunization history on file for this patient.  Health Maintenance  Topic Date Due  . TETANUS/TDAP  01/04/1980  . PAP SMEAR  12/08/2014  . INFLUENZA VACCINE  07/08/2017  . MAMMOGRAM  10/10/2017  . COLONOSCOPY  12/08/2021  . Hepatitis C Screening  Completed  . HIV Screening  Completed    Lab Results  Component Value Date   WBC 6.7 10/09/2016   HGB 13.6 10/09/2016   HCT 41.0 10/09/2016   PLT 233.0 10/09/2016   GLUCOSE 98 10/09/2016   CHOL 206 (H) 10/09/2016   TRIG 102.0 10/09/2016   HDL 64.80 10/09/2016   LDLCALC 121 (H) 10/09/2016   ALT 12 10/09/2016   AST 19 10/09/2016   NA 140 10/09/2016   K 4.6 10/09/2016   CL 105 10/09/2016   CREATININE 0.89 10/09/2016   BUN 13 10/09/2016   CO2 30 10/09/2016   TSH 0.84 10/09/2016    Lab Results  Component Value Date   TSH 0.84 10/09/2016   Lab Results  Component Value Date   WBC 6.7 10/09/2016   HGB 13.6 10/09/2016   HCT 41.0 10/09/2016   MCV 88.8 10/09/2016   PLT 233.0 10/09/2016   Lab Results  Component Value Date   NA 140 10/09/2016   K 4.6 10/09/2016   CO2 30 10/09/2016   GLUCOSE 98 10/09/2016   BUN 13 10/09/2016   CREATININE 0.89 10/09/2016   BILITOT 0.9 10/09/2016   ALKPHOS 35 (L) 10/09/2016   AST 19 10/09/2016   ALT 12 10/09/2016   PROT 7.4 10/09/2016   ALBUMIN 4.6 10/09/2016   CALCIUM 10.0 10/09/2016   GFR 69.79 10/09/2016   Lab Results  Component Value Date   CHOL 206 (H) 10/09/2016   Lab Results  Component Value Date   HDL 64.80 10/09/2016   Lab Results  Component Value Date   LDLCALC 121 (H) 10/09/2016   Lab Results  Component Value Date   TRIG 102.0 10/09/2016   Lab Results  Component Value Date   CHOLHDL 3 10/09/2016   No results found for: HGBA1C       Assessment & Plan:   Problem List Items Addressed  This Visit    None      Dawn Burch does not currently have medications on file.  No orders of the defined types were placed in this encounter.   CMA served as Education administrator during this visit. History, Physical and Plan performed by  medical provider. Documentation and orders reviewed and attested to.  Bartholome Bill, RMA

## 2017-10-12 NOTE — Assessment & Plan Note (Signed)
Patient encouraged to maintain heart healthy diet, regular exercise, adequate sleep. Consider daily probiotics. Take medications as prescribed 

## 2017-10-12 NOTE — Assessment & Plan Note (Signed)
Was elevated last year adjusted diet by dropping egg consumption in half. Check level today

## 2017-10-12 NOTE — Assessment & Plan Note (Signed)
Encouraged stretching and orthotics, topical lidocaine and referral to podiatry if no improvement

## 2017-10-12 NOTE — Assessment & Plan Note (Signed)
Left calf BCC removed by derm this year.

## 2017-10-12 NOTE — Patient Instructions (Addendum)
Lidocaine gel (Icy Hot, Salon Pas and Aspercreme) for foot pain, if worsens consider podiatry referral  Shingrix, 2 shots over 2-6 months,   Plantar Fasciitis Plantar fasciitis is a painful foot condition that affects the heel. It occurs when the band of tissue tha connects the toes to the heel bone (plantar fascia) becomes irritated. This can happen after exercising too much or doing other repetitive activities (overuse injury). The pain from plantar fasciitis can range from mild irritation to severe pain that makes it difficult for you to walk or move. The pain is usually worse in the morning or after you have been sitting or lying down for a while. What are the causes? This condition may be caused by:  Standing for long periods of time.  Wearing shoes that do not fit.  Doing high-impact activities, including running, aerobics, and ballet.  Being overweight.  Having an abnormal way of walking (gait).  Having tight calf muscles.  Having high arches in your feet.  Starting a new athletic activity.  What are the signs or symptoms? The main symptom of this condition is heel pain. Other symptoms include:  Pain that gets worse after activity or exercise.  Pain that is worse in the morning or after resting.  Pain that goes away after you walk for a few minutes.  How is this diagnosed? This condition may be diagnosed based on your signs and symptoms. Your health care provider will also do a physical exam to check for:  A tender area on the bottom of your foot.  A high arch in your foot.  Pain when you move your foot.  Difficulty moving your foot.  You may also need to have imaging studies to confirm the diagnosis. These can include:  X-rays.  Ultrasound.  MRI.  How is this treated? Treatment for plantar fasciitis depends on the severity of the condition. Your treatment may include:  Rest, ice, and over-the-counter pain medicines to manage your pain.  Exercises to  stretch your calves and your plantar fascia.  A splint that holds your foot in a stretched, upward position while you sleep (night splint).  Physical therapy to relieve symptoms and prevent problems in the future.  Cortisone injections to relieve severe pain.  Extracorporeal shock wave therapy (ESWT) to stimulate damaged plantar fascia with electrical impulses. It is often used as a last resort before surgery.  Surgery, if other treatments have not worked after 12 months.  Follow these instructions at home:  Take medicines only as directed by your health care provider.  Avoid activities that cause pain.  Roll the bottom of your foot over a bag of ice or a bottle of cold water. Do this for 20 minutes, 3-4 times a day.  Perform simple stretches as directed by your health care provider.  Try wearing athletic shoes with air-sole or gel-sole cushions or soft shoe inserts.  Wear a night splint while sleeping, if directed by your health care provider.  Keep all follow-up appointments with your health care provider. How is this prevented?  Do not perform exercises or activities that cause heel pain.  Consider finding low-impact activities if you continue to have problems.  Lose weight if you need to. The best way to prevent plantar fasciitis is to avoid the activities that aggravate your plantar fascia. Contact a health care provider if:  Your symptoms do not go away after treatment with home care measures.  Your pain gets worse.  Your pain affects your ability to  move or do your daily activities. This information is not intended to replace advice given to you by your health care provider. Make sure you discuss any questions you have with your health care provider. Document Released: 08/19/2001 Document Revised: 04/28/2016 Document Reviewed: 10/04/2014 Elsevier Interactive Patient Education  2018 [place] Years, Female Preventive care refers to  lifestyle choices and visits with your health care provider that can promote health and wellness. What does preventive care include?  A yearly physical exam. This is also called an annual well check.  Dental exams once or twice a year.  Routine eye exams. Ask your health care provider how often you should have your eyes checked.  Personal lifestyle choices, including: ? Daily care of your teeth and gums. ? Regular physical activity. ? Eating a healthy diet. ? Avoiding tobacco and drug use. ? Limiting alcohol use. ? Practicing safe sex. ? Taking low-dose aspirin daily starting at age 61. ? Taking vitamin and mineral supplements as recommended by your health care provider. What happens during an annual well check? The services and screenings done by your health care provider during your annual well check will depend on your age, overall health, lifestyle risk factors, and family history of disease. Counseling Your health care provider may ask you questions about your:  Alcohol use.  Tobacco use.  Drug use.  Emotional well-being.  Home and relationship well-being.  Sexual activity.  Eating habits.  Work and work Statistician.  Method of birth control.  Menstrual cycle.  Pregnancy history.  Screening You may have the following tests or measurements:  Height, weight, and BMI.  Blood pressure.  Lipid and cholesterol levels. These may be checked every 5 years, or more frequently if you are over 43 years old.  Skin check.  Lung cancer screening. You may have this screening every year starting at age 82 if you have a 30-pack-year history of smoking and currently smoke or have quit within the past 15 years.  Fecal occult blood test (FOBT) of the stool. You may have this test every year starting at age 7.  Flexible sigmoidoscopy or colonoscopy. You may have a sigmoidoscopy every 5 years or a colonoscopy every 10 years starting at age 20.  Hepatitis C blood  test.  Hepatitis B blood test.  Sexually transmitted disease (STD) testing.  Diabetes screening. This is done by checking your blood sugar (glucose) after you have not eaten for a while (fasting). You may have this done every 1-3 years.  Mammogram. This may be done every 1-2 years. Talk to your health care provider about when you should start having regular mammograms. This may depend on whether you have a family history of breast cancer.  BRCA-related cancer screening. This may be done if you have a family history of breast, ovarian, tubal, or peritoneal cancers.  Pelvic exam and Pap test. This may be done every 3 years starting at age 22. Starting at age 37, this may be done every 5 years if you have a Pap test in combination with an HPV test.  Bone density scan. This is done to screen for osteoporosis. You may have this scan if you are at high risk for osteoporosis.  Discuss your test results, treatment options, and if necessary, the need for more tests with your health care provider. Vaccines Your health care provider may recommend certain vaccines, such as:  Influenza vaccine. This is recommended every year.  Tetanus, diphtheria, and acellular pertussis (Tdap, Td)  vaccine. You may need a Td booster every 10 years.  Varicella vaccine. You may need this if you have not been vaccinated.  Zoster vaccine. You may need this after age 17.  Measles, mumps, and rubella (MMR) vaccine. You may need at least one dose of MMR if you were born in 1957 or later. You may also need a second dose.  Pneumococcal 13-valent conjugate (PCV13) vaccine. You may need this if you have certain conditions and were not previously vaccinated.  Pneumococcal polysaccharide (PPSV23) vaccine. You may need one or two doses if you smoke cigarettes or if you have certain conditions.  Meningococcal vaccine. You may need this if you have certain conditions.  Hepatitis A vaccine. You may need this if you have  certain conditions or if you travel or work in places where you may be exposed to hepatitis A.  Hepatitis B vaccine. You may need this if you have certain conditions or if you travel or work in places where you may be exposed to hepatitis B.  Haemophilus influenzae type b (Hib) vaccine. You may need this if you have certain conditions.  Talk to your health care provider about which screenings and vaccines you need and how often you need them. This information is not intended to replace advice given to you by your health care provider. Make sure you discuss any questions you have with your health care provider. Document Released: 12/21/2015 Document Revised: 08/13/2016 Document Reviewed: 09/25/2015 Elsevier Interactive Patient Education  2017 [employer].

## 2017-10-12 NOTE — Assessment & Plan Note (Signed)
Encouraged heart healthy diet, increase exercise, avoid trans fats, consider a krill oil cap daily 

## 2018-10-14 ENCOUNTER — Encounter: Payer: Self-pay | Admitting: Family Medicine

## 2018-10-14 ENCOUNTER — Ambulatory Visit: Payer: 59 | Admitting: Family Medicine

## 2018-10-14 DIAGNOSIS — E782 Mixed hyperlipidemia: Secondary | ICD-10-CM | POA: Diagnosis not present

## 2018-10-14 DIAGNOSIS — E538 Deficiency of other specified B group vitamins: Secondary | ICD-10-CM | POA: Diagnosis not present

## 2018-10-14 DIAGNOSIS — Z0000 Encounter for general adult medical examination without abnormal findings: Secondary | ICD-10-CM

## 2018-10-14 LAB — CBC
HCT: 39.4 % (ref 36.0–46.0)
HEMOGLOBIN: 13.2 g/dL (ref 12.0–15.0)
MCHC: 33.5 g/dL (ref 30.0–36.0)
MCV: 91.5 fl (ref 78.0–100.0)
PLATELETS: 258 10*3/uL (ref 150.0–400.0)
RBC: 4.3 Mil/uL (ref 3.87–5.11)
RDW: 14 % (ref 11.5–15.5)
WBC: 5.9 10*3/uL (ref 4.0–10.5)

## 2018-10-14 LAB — COMPREHENSIVE METABOLIC PANEL
ALBUMIN: 4.5 g/dL (ref 3.5–5.2)
ALK PHOS: 40 U/L (ref 39–117)
ALT: 15 U/L (ref 0–35)
AST: 19 U/L (ref 0–37)
BUN: 15 mg/dL (ref 6–23)
CALCIUM: 9.4 mg/dL (ref 8.4–10.5)
CO2: 30 mEq/L (ref 19–32)
CREATININE: 0.87 mg/dL (ref 0.40–1.20)
Chloride: 102 mEq/L (ref 96–112)
GFR: 71.13 mL/min (ref >60.00)
Glucose, Bld: 90 mg/dL (ref 70–99)
Potassium: 4 mEq/L (ref 3.5–5.1)
Sodium: 138 mEq/L (ref 135–145)
Total Bilirubin: 0.7 mg/dL (ref 0.2–1.2)
Total Protein: 6.6 g/dL (ref 6.0–8.3)

## 2018-10-14 LAB — LIPID PANEL
Cholesterol: 217 mg/dL — ABNORMAL HIGH (ref 0–200)
HDL: 73.4 mg/dL (ref >39.00)
LDL Cholesterol: 133 mg/dL — ABNORMAL HIGH (ref 0–99)
NONHDL: 143.57
TRIGLYCERIDES: 55 mg/dL (ref 0.0–149.0)
Total CHOL/HDL Ratio: 3
VLDL: 11 mg/dL (ref 0.0–40.0)

## 2018-10-14 LAB — VITAMIN B12: Vitamin B-12: 734 pg/mL (ref 211–911)

## 2018-10-14 LAB — TSH: TSH: 0.96 u[IU]/mL (ref 0.35–4.50)

## 2018-10-14 NOTE — Assessment & Plan Note (Signed)
Patient encouraged to maintain heart healthy diet, regular exercise, adequate sleep. Consider daily probiotics. Take medications as prescribed 

## 2018-10-14 NOTE — Patient Instructions (Addendum)
shingrix is the new shingles shot, 2 shots over 2-6 months, call insurance regarding and return for nurse visit if you choose.   Preventive Care 40-64 Years, Female Preventive care refers to lifestyle choices and visits with your health care provider that can promote health and wellness. What does preventive care include?  A yearly physical exam. This is also called an annual well check.  Dental exams once or twice a year.  Routine eye exams. Ask your health care provider how often you should have your eyes checked.  Personal lifestyle choices, including: ? Daily care of your teeth and gums. ? Regular physical activity. ? Eating a healthy diet. ? Avoiding tobacco and drug use. ? Limiting alcohol use. ? Practicing safe sex. ? Taking low-dose aspirin daily starting at age 44. ? Taking vitamin and mineral supplements as recommended by your health care provider. What happens during an annual well check? The services and screenings done by your health care provider during your annual well check will depend on your age, overall health, lifestyle risk factors, and family history of disease. Counseling Your health care provider may ask you questions about your:  Alcohol use.  Tobacco use.  Drug use.  Emotional well-being.  Home and relationship well-being.  Sexual activity.  Eating habits.  Work and work Statistician.  Method of birth control.  Menstrual cycle.  Pregnancy history.  Screening You may have the following tests or measurements:  Height, weight, and BMI.  Blood pressure.  Lipid and cholesterol levels. These may be checked every 5 years, or more frequently if you are over 40 years old.  Skin check.  Lung cancer screening. You may have this screening every year starting at age 14 if you have a 30-pack-year history of smoking and currently smoke or have quit within the past 15 years.  Fecal occult blood test (FOBT) of the stool. You may have this test  every year starting at age 44.  Flexible sigmoidoscopy or colonoscopy. You may have a sigmoidoscopy every 5 years or a colonoscopy every 10 years starting at age 57.  Hepatitis C blood test.  Hepatitis B blood test.  Sexually transmitted disease (STD) testing.  Diabetes screening. This is done by checking your blood sugar (glucose) after you have not eaten for a while (fasting). You may have this done every 1-3 years.  Mammogram. This may be done every 1-2 years. Talk to your health care provider about when you should start having regular mammograms. This may depend on whether you have a family history of breast cancer.  BRCA-related cancer screening. This may be done if you have a family history of breast, ovarian, tubal, or peritoneal cancers.  Pelvic exam and Pap test. This may be done every 3 years starting at age 57. Starting at age 19, this may be done every 5 years if you have a Pap test in combination with an HPV test.  Bone density scan. This is done to screen for osteoporosis. You may have this scan if you are at high risk for osteoporosis.  Discuss your test results, treatment options, and if necessary, the need for more tests with your health care provider. Vaccines Your health care provider may recommend certain vaccines, such as:  Influenza vaccine. This is recommended every year.  Tetanus, diphtheria, and acellular pertussis (Tdap, Td) vaccine. You may need a Td booster every 10 years.  Varicella vaccine. You may need this if you have not been vaccinated.  Zoster vaccine. You may need this  after age 67.  Measles, mumps, and rubella (MMR) vaccine. You may need at least one dose of MMR if you were born in 1957 or later. You may also need a second dose.  Pneumococcal 13-valent conjugate (PCV13) vaccine. You may need this if you have certain conditions and were not previously vaccinated.  Pneumococcal polysaccharide (PPSV23) vaccine. You may need one or two doses if you  smoke cigarettes or if you have certain conditions.  Meningococcal vaccine. You may need this if you have certain conditions.  Hepatitis A vaccine. You may need this if you have certain conditions or if you travel or work in places where you may be exposed to hepatitis A.  Hepatitis B vaccine. You may need this if you have certain conditions or if you travel or work in places where you may be exposed to hepatitis B.  Haemophilus influenzae type b (Hib) vaccine. You may need this if you have certain conditions.  Talk to your health care provider about which screenings and vaccines you need and how often you need them. This information is not intended to replace advice given to you by your health care provider. Make sure you discuss any questions you have with your health care provider. Document Released: 12/21/2015 Document Revised: 08/13/2016 Document Reviewed: 09/25/2015 Elsevier Interactive Patient Education  [employer].

## 2018-10-14 NOTE — Assessment & Plan Note (Signed)
Encouraged heart healthy diet, increase exercise, avoid trans fats 

## 2018-10-14 NOTE — Progress Notes (Signed)
Subjective:    Patient ID: Dawn Burch, female    DOB: Apr 29, 1961, 57 y.o.   MRN: [id number]  Chief Complaint  Patient presents with  . Annual Exam    HPI Patient is in today for annual preventative exam.  She feels well today.  No recent febrile illness or hospitalization.  She stays active and maintains her heart healthy diet.  She has no acute concerns. Denies CP/palp/SOB/HA/congestion/fevers/GI or GU c/o. Taking meds as prescribed  Past Medical History:  Diagnosis Date  . Allergic state 05/07/2014  . BCC (basal cell carcinoma of skin)    basal- under R eye   . Cancer ([place]) 20's   basal- under R eye  . Chicken pox as a child  . 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 Fibrocystic breast 05/07/2014  . Hyperlipidemia, mixed 10/21/2015  . Plantar fasciitis, bilateral 10/12/2017    Past Surgical History:  Procedure Laterality Date  . BREAST SURGERY  14 yrs ago   biopsy- left breast  . HERNIA REPAIR  2010  . SKIN SURGERY  20's   basal cell removal around right eye    Family History  Problem Relation Age of Onset  . Heart Problems Mother        palpitations  . Cancer Mother 29       breast- left  . Diabetes Mother        type 2  . Melanoma Mother   . Atrial fibrillation Mother   . Cancer Maternal Grandmother 50       breast- left, skin on face, colon in 80s or 36s  . Glaucoma Maternal Grandfather   . Aneurysm Sister        bain with bleeds  . Other Son        recurrent concussion  . Diabetes Son   . Diabetes Brother   . Alcoholism Brother   . Kidney disease Maternal Aunt     Social History   Socioeconomic History  . Marital status: Married    Spouse name: Not on file  . Number of children: Not on file  . Years of education: Not on file  . Highest education level: Not on file  Occupational History  . Not on file  Social Needs  . Financial resource strain: Not on file  . Food insecurity:    Worry: Not on file    Inability: Not on file  . Transportation  needs:    Medical: Not on file    Non-medical: Not on file  Tobacco Use  . Smoking status: Never Smoker  . Smokeless tobacco: Never Used  Substance and Sexual Activity  . Alcohol use: Yes    Comment: occasionally  . Drug use: No  . Sexual activity: Yes    Partners: Male    Comment: minimizes wheat. lives with husband, son  Lifestyle  . Physical activity:    Days per week: Not on file    Minutes per session: Not on file  . Stress: Not on file  Relationships  . Social connections:    Talks on phone: Not on file    Gets together: Not on file    Attends religious service: Not on file    Active member of club or organization: Not on file    Attends meetings of clubs or organizations: Not on file    Relationship status: Not on file  . Intimate partner violence:    Fear of current or ex partner: Not on file  Emotionally abused: Not on file    Physically abused: Not on file    Forced sexual activity: Not on file  Other Topics Concern  . Not on file  Social History Narrative  . Not on file    No outpatient medications prior to visit.   No facility-administered medications prior to visit.     Allergies  Allergen Reactions  . Penicillins     Review of Systems  Constitutional: Negative for chills, fever and malaise/fatigue.  HENT: Negative for congestion and hearing loss.   Eyes: Negative for discharge.  Respiratory: Negative for cough, sputum production and shortness of breath.   Cardiovascular: Negative for chest pain, palpitations and leg swelling.  Gastrointestinal: Negative for abdominal pain, blood in stool, constipation, diarrhea, heartburn, nausea and vomiting.  Genitourinary: Negative for dysuria, frequency, hematuria and urgency.  Musculoskeletal: Negative for back pain, falls and myalgias.  Skin: Negative for rash.  Neurological: Negative for dizziness, sensory change, loss of consciousness, weakness and headaches.  Endo/Heme/Allergies: Negative for  environmental allergies. Does not bruise/bleed easily.  Psychiatric/Behavioral: Negative for depression and suicidal ideas. The patient is not nervous/anxious and does not have insomnia.        Objective:    Physical Exam  Constitutional: She is oriented to person, place, and time. She appears well-developed and well-nourished. No distress.  HENT:  Head: Normocephalic and atraumatic.  Eyes: Conjunctivae are normal.  Neck: Neck supple. No thyromegaly present.  Cardiovascular: Normal rate, regular rhythm and normal heart sounds.  No murmur heard. Pulmonary/Chest: Effort normal and breath sounds normal. No respiratory distress.  Abdominal: Soft. Bowel sounds are normal. She exhibits no distension and no mass. There is no tenderness.  Musculoskeletal: She exhibits no edema.  Lymphadenopathy:    She has no cervical adenopathy.  Neurological: She is alert and oriented to person, place, and time.  Skin: Skin is warm and dry.  Psychiatric: She has a normal mood and affect. Her behavior is normal.    BP 92/65 (BP Location: Left Arm, Patient Position: Sitting, Cuff Size: Small)   Pulse 65   Temp 98.5 F (36.9 C) (Oral)   Resp 16   Ht 5' 7.3" (1.709 m)   Wt 124 lb (56.2 kg)   SpO2 100%   BMI 19.25 kg/m  Wt Readings from Last 3 Encounters:  10/14/18 124 lb (56.2 kg)  10/12/17 127 lb 12.8 oz (58 kg)  10/09/16 131 lb 6.4 oz (59.6 kg)     Lab Results  Component Value Date   WBC 5.9 10/14/2018   HGB 13.2 10/14/2018   HCT 39.4 10/14/2018   PLT 258.0 10/14/2018   GLUCOSE 90 10/14/2018   CHOL 217 (H) 10/14/2018   TRIG 55.0 10/14/2018   HDL 73.40 10/14/2018   LDLCALC 133 (H) 10/14/2018   ALT 15 10/14/2018   AST 19 10/14/2018   NA 138 10/14/2018   K 4.0 10/14/2018   CL 102 10/14/2018   CREATININE 0.87 10/14/2018   BUN 15 10/14/2018   CO2 30 10/14/2018   TSH 0.96 10/14/2018    Lab Results  Component Value Date   TSH 0.96 10/14/2018   Lab Results  Component Value Date    WBC 5.9 10/14/2018   HGB 13.2 10/14/2018   HCT 39.4 10/14/2018   MCV 91.5 10/14/2018   PLT 258.0 10/14/2018   Lab Results  Component Value Date   NA 138 10/14/2018   K 4.0 10/14/2018   CO2 30 10/14/2018   GLUCOSE 90 10/14/2018  BUN 15 10/14/2018   CREATININE 0.87 10/14/2018   BILITOT 0.7 10/14/2018   ALKPHOS 40 10/14/2018   AST 19 10/14/2018   ALT 15 10/14/2018   PROT 6.6 10/14/2018   ALBUMIN 4.5 10/14/2018   CALCIUM 9.4 10/14/2018   GFR 71.13 10/14/2018   Lab Results  Component Value Date   CHOL 217 (H) 10/14/2018   Lab Results  Component Value Date   HDL 73.40 10/14/2018   Lab Results  Component Value Date   LDLCALC 133 (H) 10/14/2018   Lab Results  Component Value Date   TRIG 55.0 10/14/2018   Lab Results  Component Value Date   CHOLHDL 3 10/14/2018   No results found for: HGBA1C     Assessment & Plan:   Problem List Items Addressed This Visit    Vitamin B12 deficiency    Supplement and monitor      Relevant Orders   Vitamin B12 (Completed)   Preventative health care    Patient encouraged to maintain heart healthy diet, regular exercise, adequate sleep. Consider daily probiotics. Take medications as prescribed.       Relevant Orders   CBC (Completed)   Comprehensive metabolic panel (Completed)   TSH (Completed)   Hyperlipidemia, mixed    Encouraged heart healthy diet, increase exercise, avoid trans fats      Relevant Orders   Lipid panel (Completed)      Deshon Koslowski does not currently have medications on file.  No orders of the defined types were placed in this encounter.    Penni Homans, MD

## 2018-10-14 NOTE — Assessment & Plan Note (Signed)
Supplement and monitor 

## 2019-10-14 ENCOUNTER — Other Ambulatory Visit: Payer: Self-pay

## 2019-10-18 ENCOUNTER — Ambulatory Visit (INDEPENDENT_AMBULATORY_CARE_PROVIDER_SITE_OTHER): Payer: 59 | Admitting: Family Medicine

## 2019-10-18 ENCOUNTER — Encounter: Payer: Self-pay | Admitting: Family Medicine

## 2019-10-18 ENCOUNTER — Other Ambulatory Visit: Payer: Self-pay

## 2019-10-18 VITALS — BP 90/58 | HR 68 | Temp 97.6°F | Resp 18 | Ht 67.5 in | Wt 130.8 lb

## 2019-10-18 DIAGNOSIS — E782 Mixed hyperlipidemia: Secondary | ICD-10-CM

## 2019-10-18 DIAGNOSIS — N6019 Diffuse cystic mastopathy of unspecified breast: Secondary | ICD-10-CM | POA: Diagnosis not present

## 2019-10-18 DIAGNOSIS — Z0000 Encounter for general adult medical examination without abnormal findings: Secondary | ICD-10-CM

## 2019-10-18 DIAGNOSIS — E538 Deficiency of other specified B group vitamins: Secondary | ICD-10-CM | POA: Diagnosis not present

## 2019-10-18 DIAGNOSIS — C4491 Basal cell carcinoma of skin, unspecified: Secondary | ICD-10-CM

## 2019-10-18 LAB — COMPREHENSIVE METABOLIC PANEL
ALT: 13 U/L (ref 0–35)
AST: 17 U/L (ref 0–37)
Albumin: 4.5 g/dL (ref 3.5–5.2)
Alkaline Phosphatase: 44 U/L (ref 39–117)
BUN: 12 mg/dL (ref 6–23)
CO2: 30 mEq/L (ref 19–32)
Calcium: 9.3 mg/dL (ref 8.4–10.5)
Chloride: 103 mEq/L (ref 96–112)
Creatinine, Ser: 0.82 mg/dL (ref 0.40–1.20)
GFR: 71.41 mL/min (ref >60.00)
Glucose, Bld: 96 mg/dL (ref 70–99)
Potassium: 4.2 mEq/L (ref 3.5–5.1)
Sodium: 140 mEq/L (ref 135–145)
Total Bilirubin: 0.6 mg/dL (ref 0.2–1.2)
Total Protein: 6.7 g/dL (ref 6.0–8.3)

## 2019-10-18 LAB — CBC
HCT: 38.6 % (ref 36.0–46.0)
Hemoglobin: 12.8 g/dL (ref 12.0–15.0)
MCHC: 33.2 g/dL (ref 30.0–36.0)
MCV: 89.5 fl (ref 78.0–100.0)
Platelets: 236 10*3/uL (ref 150.0–400.0)
RBC: 4.31 Mil/uL (ref 3.87–5.11)
RDW: 13.4 % (ref 11.5–15.5)
WBC: 5.5 10*3/uL (ref 4.0–10.5)

## 2019-10-18 LAB — LIPID PANEL
Cholesterol: 228 mg/dL — ABNORMAL HIGH (ref 0–200)
HDL: 67.7 mg/dL (ref >39.00)
LDL Cholesterol: 146 mg/dL — ABNORMAL HIGH (ref 0–99)
NonHDL: 160.7
Total CHOL/HDL Ratio: 3
Triglycerides: 72 mg/dL (ref 0.0–149.0)
VLDL: 14.4 mg/dL (ref 0.0–40.0)

## 2019-10-18 LAB — URINALYSIS
Bilirubin Urine: NEGATIVE
Hgb urine dipstick: NEGATIVE
Ketones, ur: NEGATIVE
Leukocytes,Ua: NEGATIVE
Nitrite: NEGATIVE
Specific Gravity, Urine: 1.015 (ref 1.000–1.030)
Total Protein, Urine: NEGATIVE
Urine Glucose: NEGATIVE
Urobilinogen, UA: 0.2 (ref 0.0–1.0)
pH: 7 (ref 5.0–8.0)

## 2019-10-18 LAB — TSH: TSH: 0.75 u[IU]/mL (ref 0.35–4.50)

## 2019-10-18 NOTE — Assessment & Plan Note (Signed)
Encouraged heart healthy diet, increase exercise, avoid trans fats, consider a fatty acid supplement 

## 2019-10-18 NOTE — Assessment & Plan Note (Addendum)
Patient encouraged to maintain heart healthy diet, regular exercise, adequate sleep. Consider daily probiotics. Take medications as prescribed. Rel of rec signed to obtain copy of last colonoscopy due in roughly 2 years. She has not had a mammogram for years and declines one at this time. She reports since using some essential oils she has not noted any further cystic lesions. She is encouraged to proceed with MGM but declines at this time. Historically she has seen gynecology but also notes she has stopped pap smears and once again is encouraged to consider proceeding with paps every 3-5 years and declines. She denies any vaginal bleeding or discharge at this time. She will notify us if she is willing to proceed with any testing. Labs ordered today

## 2019-10-18 NOTE — Assessment & Plan Note (Signed)
Supplement and monitor 

## 2019-10-18 NOTE — Assessment & Plan Note (Signed)
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 She has not been pleased with her new dermatologist so she has not gone back. She is encouraged to try Dr Renda Rolls and she will let us know if she needs a referral

## 2019-10-18 NOTE — Patient Instructions (Addendum)
Omron blood pressure cuff, upper arm  Pulse oximeter, want oxygen in 90s  Weekly vital  Zinc, selenium, vitamin C and Vitamin D Preventive Care 28-58 Years Old, Female Preventive care refers to visits with your health care provider and lifestyle choices that can promote health and wellness. This includes:  A yearly physical exam. This may also be called an annual well check.  Regular dental visits and eye exams.  Immunizations.  Screening for certain conditions.  Healthy lifestyle choices, such as eating a healthy diet, getting regular exercise, not using drugs or products that contain nicotine and tobacco, and limiting alcohol use. What can I expect for my preventive care visit? Physical exam Your health care provider will check your:  Height and weight. This may be used to calculate body mass index (BMI), which tells if you are at a healthy weight.  Heart rate and blood pressure.  Skin for abnormal spots. Counseling Your health care provider may ask you questions about your:  Alcohol, tobacco, and drug use.  Emotional well-being.  Home and relationship well-being.  Sexual activity.  Eating habits.  Work and work Statistician.  Method of birth control.  Menstrual cycle.  Pregnancy history. What immunizations do I need?  Influenza (flu) vaccine  This is recommended every year. Tetanus, diphtheria, and pertussis (Tdap) vaccine  You may need a Td booster every 10 years. Varicella (chickenpox) vaccine  You may need this if you have not been vaccinated. Zoster (shingles) vaccine  You may need this after age 37. Measles, mumps, and rubella (MMR) vaccine  You may need at least one dose of MMR if you were born in 1957 or later. You may also need a second dose. Pneumococcal conjugate (PCV13) vaccine  You may need this if you have certain conditions and were not previously vaccinated. Pneumococcal polysaccharide (PPSV23) vaccine  You may need one or two  doses if you smoke cigarettes or if you have certain conditions. Meningococcal conjugate (MenACWY) vaccine  You may need this if you have certain conditions. Hepatitis A vaccine  You may need this if you have certain conditions or if you travel or work in places where you may be exposed to hepatitis A. Hepatitis B vaccine  You may need this if you have certain conditions or if you travel or work in places where you may be exposed to hepatitis B. Haemophilus influenzae type b (Hib) vaccine  You may need this if you have certain conditions. Human papillomavirus (HPV) vaccine  If recommended by your health care provider, you may need three doses over 6 months. You may receive vaccines as individual doses or as more than one vaccine together in one shot (combination vaccines). Talk with your health care provider about the risks and benefits of combination vaccines. What tests do I need? Blood tests  Lipid and cholesterol levels. These may be checked every 5 years, or more frequently if you are over 27 years old.  Hepatitis C test.  Hepatitis B test. Screening  Lung cancer screening. You may have this screening every year starting at age 51 if you have a 30-pack-year history of smoking and currently smoke or have quit within the past 15 years.  Colorectal cancer screening. All adults should have this screening starting at age 67 and continuing until age 68. Your health care provider may recommend screening at age 61 if you are at increased risk. You will have tests every 1-10 years, depending on your results and the type of screening test.  Diabetes screening. This is done by checking your blood sugar (glucose) after you have not eaten for a while (fasting). You may have this done every 1-3 years.  Mammogram. This may be done every 1-2 years. Talk with your health care provider about when you should start having regular mammograms. This may depend on whether you have a family history of  breast cancer.  BRCA-related cancer screening. This may be done if you have a family history of breast, ovarian, tubal, or peritoneal cancers.  Pelvic exam and Pap test. This may be done every 3 years starting at age 82. Starting at age 27, this may be done every 5 years if you have a Pap test in combination with an HPV test. Other tests  Sexually transmitted disease (STD) testing.  Bone density scan. This is done to screen for osteoporosis. You may have this scan if you are at high risk for osteoporosis. Follow these instructions at home: Eating and drinking  Eat a diet that includes fresh fruits and vegetables, whole grains, lean protein, and low-fat dairy.  Take vitamin and mineral supplements as recommended by your health care provider.  Do not drink alcohol if: ? Your health care provider tells you not to drink. ? You are pregnant, may be pregnant, or are planning to become pregnant.  If you drink alcohol: ? Limit how much you have to 0-1 drink a day. ? Be aware of how much alcohol is in your drink. In the [place] one drink equals one 12 oz bottle of beer (355 mL), one 5 oz glass of wine (148 mL), or one 1 oz glass of hard liquor (44 mL). Lifestyle  Take daily care of your teeth and gums.  Stay active. Exercise for at least 30 minutes on 5 or more days each week.  Do not use any products that contain nicotine or tobacco, such as cigarettes, e-cigarettes, and chewing tobacco. If you need help quitting, ask your health care provider.  If you are sexually active, practice safe sex. Use a condom or other form of birth control (contraception) in order to prevent pregnancy and STIs (sexually transmitted infections).  If told by your health care provider, take low-dose aspirin daily starting at age 54. What's next?  Visit your health care provider once a year for a well check visit.  Ask your health care provider how often you should have your eyes and teeth checked.  Stay up to  date on all vaccines. This information is not intended to replace advice given to you by your health care provider. Make sure you discuss any questions you have with your health care provider. Document Released: 12/21/2015 Document Revised: 08/05/2018 Document Reviewed: 08/05/2018 Elsevier Patient Education  2020 [employer].

## 2019-10-18 NOTE — Assessment & Plan Note (Signed)
She has not had a mammogram for years and declines one at this time. She reports since using some essential oils she has not noted any further cystic lesions. She is encouraged to proceed with MGM but declines at this time. Historically she has seen gynecology but also notes she has stopped pap smears and once again is encouraged to consider proceeding with paps every 3-5 years and declines. She denies any vaginal bleeding or discharge at this time. She will notify us if she is willing to proceed with any testing

## 2019-10-18 NOTE — Progress Notes (Signed)
Patient ID: Dawn Burch, female   DOB: February 16, 1961, 58 y.o.   MRN: [id number]   Subjective:    Patient ID: Dawn Burch, female    DOB: [birth year], 58 y.o.   MRN: [id number]  No chief complaint on file.   HPI Patient is in today for annual preventative exam. She is doing very well. No recent febrile illness or hospitalizations. She is maintaining quarantine well. She stays active and maintains a heart healthy diet. She uses essential oils regularly. .Denies CP/palp/SOB/HA/congestion/fevers/GI or GU c/o. Taking meds as prescribed  Past Medical History:  Diagnosis Date  . Allergic state 05/07/2014  . BCC (basal cell carcinoma of skin)    basal- under R eye   . Cancer ([place]) 20's   basal- under R eye  . Chicken pox as a child  . 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 Fibrocystic breast 05/07/2014  . Hyperlipidemia, mixed 10/21/2015  . Plantar fasciitis, bilateral 10/12/2017    Past Surgical History:  Procedure Laterality Date  . BREAST SURGERY  14 yrs ago   biopsy- left breast  . HERNIA REPAIR  2010  . SKIN SURGERY  20's   basal cell removal around right eye    Family History  Problem Relation Age of Onset  . Heart Problems Mother        palpitations  . Cancer Mother 34       breast- left  . Diabetes Mother        type 2  . Melanoma Mother   . Atrial fibrillation Mother   . Cancer Maternal Grandmother 45       breast- left, skin on face, colon in 67s or 42s  . Glaucoma Maternal Grandfather   . Aneurysm Sister        bain with bleeds  . Other Son        recurrent concussion  . Diabetes Son   . Diabetes Brother   . Alcoholism Brother   . Kidney disease Maternal Aunt     Social History   Socioeconomic History  . Marital status: Married    Spouse name: Not on file  . Number of children: Not on file  . Years of education: Not on file  . Highest education level: Not on file  Occupational History  . Not on file  Social Needs  . Financial resource strain: Not on file   . Food insecurity    Worry: Not on file    Inability: Not on file  . Transportation needs    Medical: Not on file    Non-medical: Not on file  Tobacco Use  . Smoking status: Never Smoker  . Smokeless tobacco: Never Used  Substance and Sexual Activity  . Alcohol use: Yes    Comment: occasionally  . Drug use: No  . Sexual activity: Yes    Partners: Male    Comment: minimizes wheat. lives with husband, son  Lifestyle  . Physical activity    Days per week: Not on file    Minutes per session: Not on file  . Stress: Not on file  Relationships  . Social Herbalist on phone: Not on file    Gets together: Not on file    Attends religious service: Not on file    Active member of club or organization: Not on file    Attends meetings of clubs or organizations: Not on file    Relationship status: Not on file  . Intimate partner violence  Fear of current or ex partner: Not on file    Emotionally abused: Not on file    Physically abused: Not on file    Forced sexual activity: Not on file  Other Topics Concern  . Not on file  Social History Narrative  . Not on file    No outpatient medications prior to visit.   No facility-administered medications prior to visit.     Allergies  Allergen Reactions  . Penicillins     Review of Systems  Constitutional: Negative for chills, fever and malaise/fatigue.  HENT: Negative for congestion and hearing loss.   Eyes: Negative for discharge.  Respiratory: Negative for cough, sputum production and shortness of breath.   Cardiovascular: Negative for chest pain, palpitations and leg swelling.  Gastrointestinal: Negative for abdominal pain, blood in stool, constipation, diarrhea, heartburn, nausea and vomiting.  Genitourinary: Negative for dysuria, frequency, hematuria and urgency.  Musculoskeletal: Negative for back pain, falls and myalgias.  Skin: Negative for rash.  Neurological: Negative for dizziness, sensory change, loss of  consciousness, weakness and headaches.  Endo/Heme/Allergies: Negative for environmental allergies. Does not bruise/bleed easily.  Psychiatric/Behavioral: Negative for depression and suicidal ideas. The patient is not nervous/anxious and does not have insomnia.        Objective:    Physical Exam  BP (!) 90/58 (BP Location: Left Arm, Patient Position: Sitting, Cuff Size: Normal)   Pulse 68   Temp 97.6 F (36.4 C) (Temporal)   Resp 18   Ht 5' 7.5" (1.715 m)   Wt 130 lb 12.8 oz (59.3 kg)   SpO2 99%   BMI 20.18 kg/m  Wt Readings from Last 3 Encounters:  10/18/19 130 lb 12.8 oz (59.3 kg)  10/14/18 124 lb (56.2 kg)  10/12/17 127 lb 12.8 oz (58 kg)    Diabetic Foot Exam - Simple   No data filed     Lab Results  Component Value Date   WBC 5.9 10/14/2018   HGB 13.2 10/14/2018   HCT 39.4 10/14/2018   PLT 258.0 10/14/2018   GLUCOSE 90 10/14/2018   CHOL 217 (H) 10/14/2018   TRIG 55.0 10/14/2018   HDL 73.40 10/14/2018   LDLCALC 133 (H) 10/14/2018   ALT 15 10/14/2018   AST 19 10/14/2018   NA 138 10/14/2018   K 4.0 10/14/2018   CL 102 10/14/2018   CREATININE 0.87 10/14/2018   BUN 15 10/14/2018   CO2 30 10/14/2018   TSH 0.96 10/14/2018    Lab Results  Component Value Date   TSH 0.96 10/14/2018   Lab Results  Component Value Date   WBC 5.9 10/14/2018   HGB 13.2 10/14/2018   HCT 39.4 10/14/2018   MCV 91.5 10/14/2018   PLT 258.0 10/14/2018   Lab Results  Component Value Date   NA 138 10/14/2018   K 4.0 10/14/2018   CO2 30 10/14/2018   GLUCOSE 90 10/14/2018   BUN 15 10/14/2018   CREATININE 0.87 10/14/2018   BILITOT 0.7 10/14/2018   ALKPHOS 40 10/14/2018   AST 19 10/14/2018   ALT 15 10/14/2018   PROT 6.6 10/14/2018   ALBUMIN 4.5 10/14/2018   CALCIUM 9.4 10/14/2018   GFR 71.13 10/14/2018   Lab Results  Component Value Date   CHOL 217 (H) 10/14/2018   Lab Results  Component Value Date   HDL 73.40 10/14/2018   Lab Results  Component Value Date    LDLCALC 133 (H) 10/14/2018   Lab Results  Component Value Date  TRIG 55.0 10/14/2018   Lab Results  Component Value Date   CHOLHDL 3 10/14/2018   No results found for: HGBA1C     Assessment & Plan:   Problem List Items Addressed This Visit    BCC (basal cell carcinoma of skin)    She notes she has had a couple of BCC and a SCC most recently on her left calf muscle. She has not been pleased with her new dermatologist so she has not gone back. She is encouraged to try Dr Renda Rolls and she will let us know if she needs a referral      Vitamin B12 deficiency    Supplement and monitor      Preventative health care - Primary    Patient encouraged to maintain heart healthy diet, regular exercise, adequate sleep. Consider daily probiotics. Take medications as prescribed. Rel of rec signed to obtain copy of last colonoscopy due in roughly 2 years. She has not had a mammogram for years and declines one at this time. She reports since using some essential oils she has not noted any further cystic lesions. She is encouraged to proceed with MGM but declines at this time. Historically she has seen gynecology but also notes she has stopped pap smears and once again is encouraged to consider proceeding with paps every 3-5 years and declines. She denies any vaginal bleeding or discharge at this time. She will notify us if she is willing to proceed with any testing. Labs ordered today      Relevant Orders   CBC   CMP   TSH   Urinalysis (LAB COLLECT)   Fibrocystic breast    She has not had a mammogram for years and declines one at this time. She reports since using some essential oils she has not noted any further cystic lesions. She is encouraged to proceed with MGM but declines at this time. Historically she has seen gynecology but also notes she has stopped pap smears and once again is encouraged to consider proceeding with paps every 3-5 years and declines. She denies any vaginal bleeding or  discharge at this time. She will notify us if she is willing to proceed with any testing      Hyperlipidemia, mixed    Encouraged heart healthy diet, increase exercise, avoid trans fats, consider a fatty acid supplement      Relevant Orders   Lipid panel      Madeline Roley does not currently have medications on file.  No orders of the defined types were placed in this encounter.    Penni Homans, MD

## 2019-10-28 ENCOUNTER — Encounter: Payer: Self-pay | Admitting: Family Medicine

## 2020-10-18 ENCOUNTER — Other Ambulatory Visit: Payer: Self-pay

## 2020-10-18 ENCOUNTER — Encounter: Payer: Self-pay | Admitting: Family Medicine

## 2020-10-18 ENCOUNTER — Ambulatory Visit: Payer: 59 | Admitting: Family Medicine

## 2020-10-18 VITALS — BP 102/64 | HR 77 | Temp 98.5°F | Resp 16 | Ht 67.0 in | Wt 132.6 lb

## 2020-10-18 DIAGNOSIS — E538 Deficiency of other specified B group vitamins: Secondary | ICD-10-CM | POA: Diagnosis not present

## 2020-10-18 DIAGNOSIS — S8012XS Contusion of left lower leg, sequela: Secondary | ICD-10-CM

## 2020-10-18 DIAGNOSIS — Z0000 Encounter for general adult medical examination without abnormal findings: Secondary | ICD-10-CM | POA: Diagnosis not present

## 2020-10-18 DIAGNOSIS — S8012XA Contusion of left lower leg, initial encounter: Secondary | ICD-10-CM | POA: Insufficient documentation

## 2020-10-18 DIAGNOSIS — E782 Mixed hyperlipidemia: Secondary | ICD-10-CM

## 2020-10-18 LAB — CBC
HCT: 39.6 % (ref 36.0–46.0)
Hemoglobin: 13.6 g/dL (ref 12.0–15.0)
MCHC: 34.2 g/dL (ref 30.0–36.0)
MCV: 88.6 fl (ref 78.0–100.0)
Platelets: 237 10*3/uL (ref 150.0–400.0)
RBC: 4.47 Mil/uL (ref 3.87–5.11)
RDW: 14 % (ref 11.5–15.5)
WBC: 5.9 10*3/uL (ref 4.0–10.5)

## 2020-10-18 LAB — COMPREHENSIVE METABOLIC PANEL
ALT: 12 U/L (ref 0–35)
AST: 18 U/L (ref 0–37)
Albumin: 4.4 g/dL (ref 3.5–5.2)
Alkaline Phosphatase: 43 U/L (ref 39–117)
BUN: 16 mg/dL (ref 6–23)
CO2: 32 mEq/L (ref 19–32)
Calcium: 9.4 mg/dL (ref 8.4–10.5)
Chloride: 102 mEq/L (ref 96–112)
Creatinine, Ser: 0.9 mg/dL (ref 0.40–1.20)
GFR: 69.84 mL/min (ref >60.00)
Glucose, Bld: 87 mg/dL (ref 70–99)
Potassium: 4.7 mEq/L (ref 3.5–5.1)
Sodium: 138 mEq/L (ref 135–145)
Total Bilirubin: 0.7 mg/dL (ref 0.2–1.2)
Total Protein: 6.6 g/dL (ref 6.0–8.3)

## 2020-10-18 LAB — LIPID PANEL
Cholesterol: 216 mg/dL — ABNORMAL HIGH (ref 0–200)
HDL: 67.7 mg/dL (ref >39.00)
LDL Cholesterol: 132 mg/dL — ABNORMAL HIGH (ref 0–99)
NonHDL: 148.42
Total CHOL/HDL Ratio: 3
Triglycerides: 84 mg/dL (ref 0.0–149.0)
VLDL: 16.8 mg/dL (ref 0.0–40.0)

## 2020-10-18 LAB — TSH: TSH: 0.79 u[IU]/mL (ref 0.35–4.50)

## 2020-10-18 LAB — VITAMIN D 25 HYDROXY (VIT D DEFICIENCY, FRACTURES): VITD: 42.34 ng/mL (ref 30.00–100.00)

## 2020-10-18 NOTE — Assessment & Plan Note (Addendum)
Patient encouraged to maintain heart healthy diet, regular exercise, adequate sleep. Consider daily probiotics. Take medications as prescribed. Declines vaccines. Colonoscopy due in 2023, declines pap and mgm. Labs ordered and reviewed

## 2020-10-18 NOTE — Patient Instructions (Signed)
Monoclonal Antibody infusions at [facility] if COVID positive  Preventive Care 36-59 Years Old, Female Preventive care refers to visits with your health care provider and lifestyle choices that can promote health and wellness. This includes:  A yearly physical exam. This may also be called an annual well check.  Regular dental visits and eye exams.  Immunizations.  Screening for certain conditions.  Healthy lifestyle choices, such as eating a healthy diet, getting regular exercise, not using drugs or products that contain nicotine and tobacco, and limiting alcohol use. What can I expect for my preventive care visit? Physical exam Your health care provider will check your:  Height and weight. This may be used to calculate body mass index (BMI), which tells if you are at a healthy weight.  Heart rate and blood pressure.  Skin for abnormal spots. Counseling Your health care provider may ask you questions about your:  Alcohol, tobacco, and drug use.  Emotional well-being.  Home and relationship well-being.  Sexual activity.  Eating habits.  Work and work Statistician.  Method of birth control.  Menstrual cycle.  Pregnancy history. What immunizations do I need?  Influenza (flu) vaccine  This is recommended every year. Tetanus, diphtheria, and pertussis (Tdap) vaccine  You may need a Td booster every 10 years. Varicella (chickenpox) vaccine  You may need this if you have not been vaccinated. Zoster (shingles) vaccine  You may need this after age 58. Measles, mumps, and rubella (MMR) vaccine  You may need at least one dose of MMR if you were born in 1957 or later. You may also need a second dose. Pneumococcal conjugate (PCV13) vaccine  You may need this if you have certain conditions and were not previously vaccinated. Pneumococcal polysaccharide (PPSV23) vaccine  You may need one or two doses if you smoke cigarettes or if you have certain conditions. Meningococcal  conjugate (MenACWY) vaccine  You may need this if you have certain conditions. Hepatitis A vaccine  You may need this if you have certain conditions or if you travel or work in places where you may be exposed to hepatitis A. Hepatitis B vaccine  You may need this if you have certain conditions or if you travel or work in places where you may be exposed to hepatitis B. Haemophilus influenzae type b (Hib) vaccine  You may need this if you have certain conditions. Human papillomavirus (HPV) vaccine  If recommended by your health care provider, you may need three doses over 6 months. You may receive vaccines as individual doses or as more than one vaccine together in one shot (combination vaccines). Talk with your health care provider about the risks and benefits of combination vaccines. What tests do I need? Blood tests  Lipid and cholesterol levels. These may be checked every 5 years, or more frequently if you are over 2 years old.  Hepatitis C test.  Hepatitis B test. Screening  Lung cancer screening. You may have this screening every year starting at age 55 if you have a 30-pack-year history of smoking and currently smoke or have quit within the past 15 years.  Colorectal cancer screening. All adults should have this screening starting at age 64 and continuing until age 16. Your health care provider may recommend screening at age 32 if you are at increased risk. You will have tests every 1-10 years, depending on your results and the type of screening test.  Diabetes screening. This is done by checking your blood sugar (glucose) after you have not  eaten for a while (fasting). You may have this done every 1-3 years.  Mammogram. This may be done every 1-2 years. Talk with your health care provider about when you should start having regular mammograms. This may depend on whether you have a family history of breast cancer.  BRCA-related cancer screening. This may be done if you have a  family history of breast, ovarian, tubal, or peritoneal cancers.  Pelvic exam and Pap test. This may be done every 3 years starting at age 8. Starting at age 68, this may be done every 5 years if you have a Pap test in combination with an HPV test. Other tests  Sexually transmitted disease (STD) testing.  Bone density scan. This is done to screen for osteoporosis. You may have this scan if you are at high risk for osteoporosis. Follow these instructions at home: Eating and drinking  Eat a diet that includes fresh fruits and vegetables, whole grains, lean protein, and low-fat dairy.  Take vitamin and mineral supplements as recommended by your health care provider.  Do not drink alcohol if: ? Your health care provider tells you not to drink. ? You are pregnant, may be pregnant, or are planning to become pregnant.  If you drink alcohol: ? Limit how much you have to 0-1 drink a day. ? Be aware of how much alcohol is in your drink. In the [place] one drink equals one 12 oz bottle of beer (355 mL), one 5 oz glass of wine (148 mL), or one 1 oz glass of hard liquor (44 mL). Lifestyle  Take daily care of your teeth and gums.  Stay active. Exercise for at least 30 minutes on 5 or more days each week.  Do not use any products that contain nicotine or tobacco, such as cigarettes, e-cigarettes, and chewing tobacco. If you need help quitting, ask your health care provider.  If you are sexually active, practice safe sex. Use a condom or other form of birth control (contraception) in order to prevent pregnancy and STIs (sexually transmitted infections).  If told by your health care provider, take low-dose aspirin daily starting at age 47. What's next?  Visit your health care provider once a year for a well check visit.  Ask your health care provider how often you should have your eyes and teeth checked.  Stay up to date on all vaccines. This information is not intended to replace advice given  to you by your health care provider. Make sure you discuss any questions you have with your health care provider. Document Revised: 08/05/2018 Document Reviewed: 08/05/2018 Elsevier Patient Education  2020 [employer].

## 2020-10-18 NOTE — Assessment & Plan Note (Signed)
encouraged heart healthy diet, avoid trans fats, minimize simple carbs and saturated fats. Increase exercise as tolerated 

## 2020-10-18 NOTE — Assessment & Plan Note (Signed)
Supplement and monitor 
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
## 2020-10-19 ENCOUNTER — Telehealth: Payer: Self-pay

## 2020-10-19 NOTE — Telephone Encounter (Signed)
Pt is aware of lab results.

## 2020-10-19 NOTE — Telephone Encounter (Signed)
Attempted to call pt to go over lab results. LVM to call back.

## 2020-12-28 DIAGNOSIS — H353210 Exudative age-related macular degeneration, right eye, stage unspecified: Secondary | ICD-10-CM | POA: Diagnosis not present

## 2020-12-28 DIAGNOSIS — H353120 Nonexudative age-related macular degeneration, left eye, stage unspecified: Secondary | ICD-10-CM | POA: Diagnosis not present

## 2020-12-28 DIAGNOSIS — H353122 Nonexudative age-related macular degeneration, left eye, intermediate dry stage: Secondary | ICD-10-CM | POA: Diagnosis not present

## 2020-12-28 DIAGNOSIS — H353211 Exudative age-related macular degeneration, right eye, with active choroidal neovascularization: Secondary | ICD-10-CM | POA: Diagnosis not present

## 2020-12-28 DIAGNOSIS — H538 Other visual disturbances: Secondary | ICD-10-CM | POA: Diagnosis not present

## 2020-12-30 ENCOUNTER — Encounter: Payer: Self-pay | Admitting: Family Medicine

## 2021-01-01 DIAGNOSIS — H353211 Exudative age-related macular degeneration, right eye, with active choroidal neovascularization: Secondary | ICD-10-CM | POA: Diagnosis not present

## 2021-01-01 DIAGNOSIS — H5203 Hypermetropia, bilateral: Secondary | ICD-10-CM | POA: Diagnosis not present

## 2021-01-01 DIAGNOSIS — H35362 Drusen (degenerative) of macula, left eye: Secondary | ICD-10-CM | POA: Diagnosis not present

## 2021-01-01 DIAGNOSIS — H52201 Unspecified astigmatism, right eye: Secondary | ICD-10-CM | POA: Diagnosis not present

## 2021-01-01 DIAGNOSIS — H2513 Age-related nuclear cataract, bilateral: Secondary | ICD-10-CM | POA: Diagnosis not present

## 2021-01-01 DIAGNOSIS — H353122 Nonexudative age-related macular degeneration, left eye, intermediate dry stage: Secondary | ICD-10-CM | POA: Diagnosis not present

## 2021-01-01 DIAGNOSIS — H524 Presbyopia: Secondary | ICD-10-CM | POA: Diagnosis not present

## 2021-01-01 DIAGNOSIS — H3554 Dystrophies primarily involving the retinal pigment epithelium: Secondary | ICD-10-CM | POA: Diagnosis not present

## 2021-01-28 DIAGNOSIS — H353211 Exudative age-related macular degeneration, right eye, with active choroidal neovascularization: Secondary | ICD-10-CM | POA: Diagnosis not present

## 2021-01-28 DIAGNOSIS — H353122 Nonexudative age-related macular degeneration, left eye, intermediate dry stage: Secondary | ICD-10-CM | POA: Diagnosis not present

## 2021-01-28 DIAGNOSIS — H2513 Age-related nuclear cataract, bilateral: Secondary | ICD-10-CM | POA: Diagnosis not present

## 2021-03-04 DIAGNOSIS — H2513 Age-related nuclear cataract, bilateral: Secondary | ICD-10-CM | POA: Diagnosis not present

## 2021-03-04 DIAGNOSIS — H353211 Exudative age-related macular degeneration, right eye, with active choroidal neovascularization: Secondary | ICD-10-CM | POA: Diagnosis not present

## 2021-03-04 DIAGNOSIS — H353122 Nonexudative age-related macular degeneration, left eye, intermediate dry stage: Secondary | ICD-10-CM | POA: Diagnosis not present

## 2021-04-05 DIAGNOSIS — H2513 Age-related nuclear cataract, bilateral: Secondary | ICD-10-CM | POA: Diagnosis not present

## 2021-04-05 DIAGNOSIS — H353211 Exudative age-related macular degeneration, right eye, with active choroidal neovascularization: Secondary | ICD-10-CM | POA: Diagnosis not present

## 2021-04-05 DIAGNOSIS — H353122 Nonexudative age-related macular degeneration, left eye, intermediate dry stage: Secondary | ICD-10-CM | POA: Diagnosis not present

## 2021-05-10 DIAGNOSIS — H353122 Nonexudative age-related macular degeneration, left eye, intermediate dry stage: Secondary | ICD-10-CM | POA: Diagnosis not present

## 2021-05-10 DIAGNOSIS — H2513 Age-related nuclear cataract, bilateral: Secondary | ICD-10-CM | POA: Diagnosis not present

## 2021-05-10 DIAGNOSIS — H353211 Exudative age-related macular degeneration, right eye, with active choroidal neovascularization: Secondary | ICD-10-CM | POA: Diagnosis not present

## 2021-06-24 DIAGNOSIS — H2513 Age-related nuclear cataract, bilateral: Secondary | ICD-10-CM | POA: Diagnosis not present

## 2021-06-24 DIAGNOSIS — H353122 Nonexudative age-related macular degeneration, left eye, intermediate dry stage: Secondary | ICD-10-CM | POA: Diagnosis not present

## 2021-06-24 DIAGNOSIS — H353211 Exudative age-related macular degeneration, right eye, with active choroidal neovascularization: Secondary | ICD-10-CM | POA: Diagnosis not present

## 2021-08-16 DIAGNOSIS — H353211 Exudative age-related macular degeneration, right eye, with active choroidal neovascularization: Secondary | ICD-10-CM | POA: Diagnosis not present

## 2021-08-16 DIAGNOSIS — H353122 Nonexudative age-related macular degeneration, left eye, intermediate dry stage: Secondary | ICD-10-CM | POA: Diagnosis not present

## 2021-08-16 DIAGNOSIS — H2513 Age-related nuclear cataract, bilateral: Secondary | ICD-10-CM | POA: Diagnosis not present

## 2021-10-18 DIAGNOSIS — H2513 Age-related nuclear cataract, bilateral: Secondary | ICD-10-CM | POA: Diagnosis not present

## 2021-10-18 DIAGNOSIS — H353211 Exudative age-related macular degeneration, right eye, with active choroidal neovascularization: Secondary | ICD-10-CM | POA: Diagnosis not present

## 2021-10-18 DIAGNOSIS — H353122 Nonexudative age-related macular degeneration, left eye, intermediate dry stage: Secondary | ICD-10-CM | POA: Diagnosis not present

## 2021-10-21 ENCOUNTER — Encounter: Payer: 59 | Admitting: Family Medicine

## 2021-10-22 ENCOUNTER — Ambulatory Visit (INDEPENDENT_AMBULATORY_CARE_PROVIDER_SITE_OTHER): Payer: 59 | Admitting: Family Medicine

## 2021-10-22 ENCOUNTER — Encounter: Payer: Self-pay | Admitting: Family Medicine

## 2021-10-22 ENCOUNTER — Other Ambulatory Visit: Payer: Self-pay

## 2021-10-22 VITALS — BP 92/66 | HR 85 | Temp 97.5°F | Resp 16 | Ht 67.0 in | Wt 126.2 lb

## 2021-10-22 DIAGNOSIS — Z0000 Encounter for general adult medical examination without abnormal findings: Secondary | ICD-10-CM

## 2021-10-22 DIAGNOSIS — H3530 Unspecified macular degeneration: Secondary | ICD-10-CM

## 2021-10-22 DIAGNOSIS — E559 Vitamin D deficiency, unspecified: Secondary | ICD-10-CM

## 2021-10-22 DIAGNOSIS — E782 Mixed hyperlipidemia: Secondary | ICD-10-CM

## 2021-10-22 DIAGNOSIS — E538 Deficiency of other specified B group vitamins: Secondary | ICD-10-CM | POA: Diagnosis not present

## 2021-10-22 DIAGNOSIS — R739 Hyperglycemia, unspecified: Secondary | ICD-10-CM | POA: Diagnosis not present

## 2021-10-22 NOTE — Patient Instructions (Addendum)
Molnupiravir/Paxlovid is the new COVID medication we can give you if you get COVID so make sure you test if you have symptoms because we have to treat by day 5 of symptoms for it to be effective. If you are positive let us know so we can treat. If a home test is negative and your symptoms are persistent get a PCR test. Can check testing locations at [email or web address] If you are positive we will make an appointment with [place] and we will send in molnupiravir/paxlovid if you would like it. Check with your pharmacy before we meet to confirm they have it in stock, if they do not then we can get the prescription at the [facility].   Before taking blood pressure readings sit down for 10-15 min then take reading. Avoid caffeine and energy drinks before taking readings.   Preventive Care 11-44 Years Old, Female Preventive care refers to lifestyle choices and visits with your health care provider that can promote health and wellness. Preventive care visits are also called wellness exams. What can I expect for my preventive care visit? Counseling Your health care provider may ask you questions about your: Medical history, including: Past medical problems. Family medical history. Pregnancy history. Current health, including: Menstrual cycle. Method of birth control. Emotional well-being. Home life and relationship well-being. Sexual activity and sexual health. Lifestyle, including: Alcohol, nicotine or tobacco, and drug use. Access to firearms. Diet, exercise, and sleep habits. Work and work Statistician. Sunscreen use. Safety issues such as seatbelt and bike helmet use. Physical exam Your health care provider will check your: Height and weight. These may be used to calculate your BMI (body mass index). BMI is a measurement that tells if you are at a healthy weight. Waist circumference. This measures the distance around your waistline. This measurement also tells if you are at a  healthy weight and may help predict your risk of certain diseases, such as type 2 diabetes and high blood pressure. Heart rate and blood pressure. Body temperature. Skin for abnormal spots. What immunizations do I need? Vaccines are usually given at various ages, according to a schedule. Your health care provider will recommend vaccines for you based on your age, medical history, and lifestyle or other factors, such as travel or where you work. What tests do I need? Screening Your health care provider may recommend screening tests for certain conditions. This may include: Lipid and cholesterol levels. Diabetes screening. This is done by checking your blood sugar (glucose) after you have not eaten for a while (fasting). Pelvic exam and Pap test. Hepatitis B test. Hepatitis C test. HIV (human immunodeficiency virus) test. STI (sexually transmitted infection) testing, if you are at risk. Lung cancer screening. Colorectal cancer screening. Mammogram. Talk with your health care provider about when you should start having regular mammograms. This may depend on whether you have a family history of breast cancer. BRCA-related cancer screening. This may be done if you have a family history of breast, ovarian, tubal, or peritoneal cancers. Bone density scan. This is done to screen for osteoporosis. Talk with your health care provider about your test results, treatment options, and if necessary, the need for more tests. Follow these instructions at home: Eating and drinking  Eat a diet that includes fresh fruits and vegetables, whole grains, lean protein, and low-fat dairy products. Take vitamin and mineral supplements as recommended by your health care provider. Do not drink alcohol if: Your health care provider tells you not to drink. You  are pregnant, may be pregnant, or are planning to become pregnant. If you drink alcohol: Limit how much you have to 0-1 drink a day. Know how much alcohol is  in your drink. In the [place] one drink equals one 12 oz bottle of beer (355 mL), one 5 oz glass of wine (148 mL), or one 1 oz glass of hard liquor (44 mL). Lifestyle Brush your teeth every morning and night with fluoride toothpaste. Floss one time each day. Exercise for at least 30 minutes 5 or more days each week. Do not use any products that contain nicotine or tobacco. These products include cigarettes, chewing tobacco, and vaping devices, such as e-cigarettes. If you need help quitting, ask your health care provider. Do not use drugs. If you are sexually active, practice safe sex. Use a condom or other form of protection to prevent STIs. If you do not wish to become pregnant, use a form of birth control. If you plan to become pregnant, see your health care provider for a prepregnancy visit. Take aspirin only as told by your health care provider. Make sure that you understand how much to take and what form to take. Work with your health care provider to find out whether it is safe and beneficial for you to take aspirin daily. Find healthy ways to manage stress, such as: Meditation, yoga, or listening to music. Journaling. Talking to a trusted person. Spending time with friends and family. Minimize exposure to UV radiation to reduce your risk of skin cancer. Safety Always wear your seat belt while driving or riding in a vehicle. Do not drive: If you have been drinking alcohol. Do not ride with someone who has been drinking. When you are tired or distracted. While texting. If you have been using any mind-altering substances or drugs. Wear a helmet and other protective equipment during sports activities. If you have firearms in your house, make sure you follow all gun safety procedures. Seek help if you have been physically or sexually abused. What's next? Visit your health care provider once a year for an annual wellness visit. Ask your health care provider how often you should have your  eyes and teeth checked. Stay up to date on all vaccines. This information is not intended to replace advice given to you by your health care provider. Make sure you discuss any questions you have with your health care provider. Document Revised: 05/22/2021 Document Reviewed: 05/22/2021 Elsevier Patient Education  [place].

## 2021-10-22 NOTE — Assessment & Plan Note (Addendum)
Right eye wet, Left eye dry started after she had covid November she is following with Dr Hollace Kinnier of [facility] [place]. Is improving on Avastin

## 2021-10-22 NOTE — Progress Notes (Signed)
Patient ID: Dawn Burch, female    DOB: 1961/09/18  Age: 60 y.o. MRN: [id number]    Subjective:   Chief Complaint  Patient presents with   Annual Exam   Subjective  HPI Dawn Burch presents for office visit today for comprehensive physical exam today and follow up on management of chronic concerns. She is not doing too well due to her recent tx of macular degeneration. She expresses concern regarding her BP and current weight. Denies CP/palp/SOB/HA/congestion/fevers/GI or GU c/o. Taking meds as prescribed. She reports have FMHx of colon cancer in maternal grandmother.  Covid last thanksgiving 2021: She experienced symptoms of fatigue, reduced appetite, and altered taste and smell. She had also a rash localized to the back when she initially got it. It took her a month after infection for recovery. She reports that at the moment she is experiencing residual symptoms of things not tasting/smelling right.  Macular Degeneration dx: She went to [facility] Dr. Cordelia Pen after she noticed that her vision was wavy. She was dx with macular degeneration (wet in right, and dry in left). She reports that she has been on tx for 10 weeks now and visits Dr. Cordelia Pen every month. At the moment, she reports that it has stabilized and the vision disturbances have subsided.   Review of Systems  Constitutional:  Negative for chills, fatigue and fever.  HENT:  Negative for congestion, rhinorrhea, sinus pressure, sinus pain, sore throat and trouble swallowing.   Eyes:  Negative for pain.  Respiratory:  Negative for cough and shortness of breath.   Cardiovascular:  Negative for chest pain, palpitations and leg swelling.  Gastrointestinal:  Negative for abdominal pain, blood in stool, diarrhea, nausea and vomiting.  Genitourinary:  Negative for decreased urine volume, flank pain, frequency, vaginal bleeding and vaginal discharge.  Musculoskeletal:  Negative for back pain.  Neurological:  Negative for headaches.    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   Her family history includes Alcoholism in her brother; Aneurysm in her sister; Atrial fibrillation in her mother; Cancer (age of onset: 62) in her mother; Cancer (age of onset: 24) in her maternal grandmother; Diabetes in her brother, mother, and son; Glaucoma in her maternal grandfather; Heart Problems in her mother; Kidney disease in her maternal aunt; Melanoma in her mother; Other in her son.She reports that she has never smoked. She has never used smokeless tobacco. She reports current alcohol use. She reports that she does not use drugs.  Current Outpatient Medications on File Prior to Visit  Medication Sig Dispense Refill   bevacizumab (AVASTIN) 2.5 MG/0.1ML SOSY by Intravitreal route.     No current facility-administered medications on file prior to visit.     Objective:  Objective  Physical Exam Constitutional:      General: She is not in acute distress.    Appearance: Normal appearance. She is not ill-appearing or toxic-appearing.  HENT:     Head: Normocephalic and atraumatic.     Right Ear: Tympanic membrane, ear canal and external  ear normal.     Left Ear: Tympanic membrane, ear canal and external ear normal.     Nose: No congestion or rhinorrhea.     Mouth/Throat:     Mouth: Mucous membranes are moist. No injury, lacerations or oral lesions.     Tongue: No lesions. Tongue does not deviate from midline.  Eyes:     Extraocular Movements: Extraocular movements intact.     Pupils: Pupils are equal, round, and  reactive to light.  Cardiovascular:     Rate and Rhythm: Normal rate and regular rhythm.     Pulses: Normal pulses.          Posterior tibial pulses are 2+ on the right side and 2+ on the left side.     Heart sounds: Normal heart sounds. No murmur heard. Pulmonary:     Effort: Pulmonary effort is normal. No respiratory distress.     Breath sounds: Normal breath sounds. No wheezing, rhonchi or rales.  Abdominal:     General: Bowel sounds are normal.     Palpations: Abdomen is soft. There is no mass.     Tenderness: There is no abdominal tenderness. There is no guarding.     Hernia: No hernia is present.  Musculoskeletal:        General: Normal range of motion.     Cervical back: Normal range of motion and neck supple.  Skin:    General: Skin is warm and dry.  Neurological:     Mental Status: She is alert and oriented to person, place, and time.     Cranial Nerves: No facial asymmetry.     Motor: Motor function is intact. No weakness.     Deep Tendon Reflexes:     Reflex Scores:      Patellar reflexes are 2+ on the right side and 2+ on the left side. Psychiatric:        Behavior: Behavior normal.   BP 92/66   Pulse 85   Temp (!) 97.5 F (36.4 C)   Resp 16   Ht 5\' 7"  (1.702 m)   Wt 126 lb 3.2 oz (57.2 kg)   SpO2 92%   BMI 19.77 kg/m  Wt Readings from Last 3 Encounters:  10/22/21 126 lb 3.2 oz (57.2 kg)  10/18/20 132 lb 9.6 oz (60.1 kg)  10/18/19 130 lb 12.8 oz (59.3 kg)     Lab Results  Component Value Date   WBC 6.5 10/22/2021   HGB 12.9 10/22/2021   HCT 38.9 10/22/2021   PLT 235.0 10/22/2021   GLUCOSE 89 10/22/2021   CHOL 218 (H) 10/22/2021   TRIG 106.0 10/22/2021   HDL 62.80 10/22/2021   LDLCALC 134 (H) 10/22/2021   ALT 11 10/22/2021   AST 17 10/22/2021   NA 138 10/22/2021   K 4.2 10/22/2021   CL 102 10/22/2021   CREATININE 0.87 10/22/2021   BUN 13 10/22/2021   CO2 29 10/22/2021   TSH 0.61 10/22/2021   HGBA1C 5.9 10/22/2021    MM SCREENING BREAST  TOMO BILATERAL  Result Date: 10/11/2015 CLINICAL DATA:  Screening. EXAM: DIGITAL SCREENING BILATERAL MAMMOGRAM WITH 3D TOMO WITH CAD COMPARISON:  Previous exam(s). ACR Breast Density Category c: The breast tissue is heterogeneously dense, which may obscure small masses. FINDINGS: There are no findings suspicious for malignancy. Images were processed with CAD. IMPRESSION: No mammographic evidence of malignancy. A result letter of this screening mammogram will be mailed directly to the patient. RECOMMENDATION: Screening mammogram in one  year. (Code:SM-B-01Y) BI-RADS CATEGORY  1: Negative. Electronically Signed   By: Lajean Manes M.D.   On: 10/16/2015 15:59     Assessment & Plan:  Plan    No orders of the defined types were placed in this encounter.   Problem List Items Addressed This Visit     Vitamin B12 deficiency - Primary    Supplement and monitor      Relevant Orders   CBC with Differential/Platelet (Completed)   Vitamin B12 (Completed)   Preventative health care    Patient encouraged to maintain heart healthy diet, regular exercise, adequate sleep. Consider daily probiotics. Take medications as prescribed. Had a long conversation regarding recommended vaccines, screening tests such as colonoscopy, pap, MGM, Dexa scan, Covid and flu vaccines and she declines all of them at this time.  She does agree to lab work so that is ordered and reviewed      Relevant Orders   CBC with Differential/Platelet (Completed)   TSH (Completed)   Hyperlipidemia, mixed    Encourage heart healthy diet such as MIND or DASH diet, increase exercise, avoid trans fats, simple carbohydrates and processed foods, consider a krill or fish or flaxseed oil cap daily.       Relevant Orders   Lipid panel (Completed)   TSH (Completed)   Macular degeneration    Right eye wet, Left eye dry started after she had covid November she is following with Dr Hollace Kinnier of [facility] [place]. Is improving on  Avastin      Vitamin D deficiency    Supplement and monitor      Relevant Orders   Comprehensive metabolic panel (Completed)   VITAMIN D 25 Hydroxy (Vit-D Deficiency, Fractures) (Completed)   Hyperglycemia    Mild, hgba1c acceptable, minimize simple carbs. Increase exercise as tolerated.      Relevant Orders   TSH (Completed)   Hemoglobin A1c (Completed)    Follow-up: Return in about 1 year (around 10/22/2022) for annual exam.  I, Suezanne Jacquet, acting as a scribe for Penni Homans, MD, have documented all relevent documentation on behalf of Penni Homans, MD, as directed by Penni Homans, MD while in the presence of Penni Homans, MD. DO:10/23/21.  I, Mosie Lukes, MD personally performed the services described in this documentation. All medical record entries made by the scribe were at my direction and in my presence. I have reviewed the chart and agree that the record reflects my personal performance and is accurate and complete

## 2021-10-23 DIAGNOSIS — R739 Hyperglycemia, unspecified: Secondary | ICD-10-CM | POA: Insufficient documentation

## 2021-10-23 DIAGNOSIS — E559 Vitamin D deficiency, unspecified: Secondary | ICD-10-CM | POA: Insufficient documentation

## 2021-10-23 LAB — COMPREHENSIVE METABOLIC PANEL
ALT: 11 U/L (ref 0–35)
AST: 17 U/L (ref 0–37)
Albumin: 4.8 g/dL (ref 3.5–5.2)
Alkaline Phosphatase: 44 U/L (ref 39–117)
BUN: 13 mg/dL (ref 6–23)
CO2: 29 mEq/L (ref 19–32)
Calcium: 9.8 mg/dL (ref 8.4–10.5)
Chloride: 102 mEq/L (ref 96–112)
Creatinine, Ser: 0.87 mg/dL (ref 0.40–1.20)
GFR: 72.22 mL/min (ref >60.00)
Glucose, Bld: 89 mg/dL (ref 70–99)
Potassium: 4.2 mEq/L (ref 3.5–5.1)
Sodium: 138 mEq/L (ref 135–145)
Total Bilirubin: 0.6 mg/dL (ref 0.2–1.2)
Total Protein: 7 g/dL (ref 6.0–8.3)

## 2021-10-23 LAB — VITAMIN D 25 HYDROXY (VIT D DEFICIENCY, FRACTURES): VITD: 53.23 ng/mL (ref 30.00–100.00)

## 2021-10-23 LAB — VITAMIN B12: Vitamin B-12: 452 pg/mL (ref 211–911)

## 2021-10-23 LAB — CBC WITH DIFFERENTIAL/PLATELET
Basophils Absolute: 0 10*3/uL (ref 0.0–0.1)
Basophils Relative: 0.3 % (ref 0.0–3.0)
Eosinophils Absolute: 0.1 10*3/uL (ref 0.0–0.7)
Eosinophils Relative: 1.9 % (ref 0.0–5.0)
HCT: 38.9 % (ref 36.0–46.0)
Hemoglobin: 12.9 g/dL (ref 12.0–15.0)
Lymphocytes Relative: 31.7 % (ref 12.0–46.0)
Lymphs Abs: 2 10*3/uL (ref 0.7–4.0)
MCHC: 33.2 g/dL (ref 30.0–36.0)
MCV: 88.4 fl (ref 78.0–100.0)
Monocytes Absolute: 0.3 10*3/uL (ref 0.1–1.0)
Monocytes Relative: 5 % (ref 3.0–12.0)
Neutro Abs: 3.9 10*3/uL (ref 1.4–7.7)
Neutrophils Relative %: 61.1 % (ref 43.0–77.0)
Platelets: 235 10*3/uL (ref 150.0–400.0)
RBC: 4.4 Mil/uL (ref 3.87–5.11)
RDW: 13.8 % (ref 11.5–15.5)
WBC: 6.5 10*3/uL (ref 4.0–10.5)

## 2021-10-23 LAB — LIPID PANEL
Cholesterol: 218 mg/dL — ABNORMAL HIGH (ref 0–200)
HDL: 62.8 mg/dL (ref >39.00)
LDL Cholesterol: 134 mg/dL — ABNORMAL HIGH (ref 0–99)
NonHDL: 155.47
Total CHOL/HDL Ratio: 3
Triglycerides: 106 mg/dL (ref 0.0–149.0)
VLDL: 21.2 mg/dL (ref 0.0–40.0)

## 2021-10-23 LAB — TSH: TSH: 0.61 u[IU]/mL (ref 0.35–5.50)

## 2021-10-23 LAB — HEMOGLOBIN A1C: Hgb A1c MFr Bld: 5.9 % (ref 4.6–6.5)

## 2021-10-23 NOTE — Assessment & Plan Note (Signed)
Mild, hgba1c acceptable, minimize simple carbs. Increase exercise as tolerated.

## 2021-10-23 NOTE — Assessment & Plan Note (Signed)
Encourage heart healthy diet such as MIND or DASH diet, increase exercise, avoid trans fats, simple carbohydrates and processed foods, consider a krill or fish or flaxseed oil cap daily.  °

## 2021-10-23 NOTE — Assessment & Plan Note (Signed)
Patient encouraged to maintain heart healthy diet, regular exercise, adequate sleep. Consider daily probiotics. Take medications as prescribed. Had a long conversation regarding recommended vaccines, screening tests such as colonoscopy, pap, MGM, Dexa scan, Covid and flu vaccines and she declines all of them at this time.  She does agree to lab work so that is ordered and reviewed

## 2021-10-23 NOTE — Assessment & Plan Note (Signed)
Supplement and monitor 

## 2021-10-24 ENCOUNTER — Encounter: Payer: Self-pay | Admitting: *Deleted

## 2021-12-27 DIAGNOSIS — H2513 Age-related nuclear cataract, bilateral: Secondary | ICD-10-CM | POA: Diagnosis not present

## 2021-12-27 DIAGNOSIS — H353211 Exudative age-related macular degeneration, right eye, with active choroidal neovascularization: Secondary | ICD-10-CM | POA: Diagnosis not present

## 2021-12-27 DIAGNOSIS — H353122 Nonexudative age-related macular degeneration, left eye, intermediate dry stage: Secondary | ICD-10-CM | POA: Diagnosis not present

## 2022-03-17 DIAGNOSIS — H353122 Nonexudative age-related macular degeneration, left eye, intermediate dry stage: Secondary | ICD-10-CM | POA: Diagnosis not present

## 2022-03-17 DIAGNOSIS — H2513 Age-related nuclear cataract, bilateral: Secondary | ICD-10-CM | POA: Diagnosis not present

## 2022-03-17 DIAGNOSIS — H353211 Exudative age-related macular degeneration, right eye, with active choroidal neovascularization: Secondary | ICD-10-CM | POA: Diagnosis not present

## 2022-06-23 DIAGNOSIS — H353211 Exudative age-related macular degeneration, right eye, with active choroidal neovascularization: Secondary | ICD-10-CM | POA: Diagnosis not present

## 2022-06-23 DIAGNOSIS — H353122 Nonexudative age-related macular degeneration, left eye, intermediate dry stage: Secondary | ICD-10-CM | POA: Diagnosis not present

## 2022-06-23 DIAGNOSIS — H2513 Age-related nuclear cataract, bilateral: Secondary | ICD-10-CM | POA: Diagnosis not present

## 2022-08-04 DIAGNOSIS — H2513 Age-related nuclear cataract, bilateral: Secondary | ICD-10-CM | POA: Diagnosis not present

## 2022-08-04 DIAGNOSIS — H353211 Exudative age-related macular degeneration, right eye, with active choroidal neovascularization: Secondary | ICD-10-CM | POA: Diagnosis not present

## 2022-08-04 DIAGNOSIS — H353122 Nonexudative age-related macular degeneration, left eye, intermediate dry stage: Secondary | ICD-10-CM | POA: Diagnosis not present

## 2022-10-22 NOTE — Assessment & Plan Note (Signed)
Supplement and monitor 

## 2022-10-22 NOTE — Assessment & Plan Note (Addendum)
Patient encouraged to maintain heart healthy diet, regular exercise, adequate sleep. Consider daily probiotics. Take medications as prescribed. Labs ordered and reviewed  Consider flu, covid, tetanus, shingrix vaccines

## 2022-10-22 NOTE — Assessment & Plan Note (Signed)
hgba1c acceptable, minimize simple carbs. Increase exercise as tolerated.  

## 2022-10-22 NOTE — Assessment & Plan Note (Signed)
Encourage heart healthy diet such as MIND or DASH diet, increase exercise, avoid trans fats, simple carbohydrates and processed foods, consider a krill or fish or flaxseed oil cap daily.  °

## 2022-10-23 ENCOUNTER — Ambulatory Visit (INDEPENDENT_AMBULATORY_CARE_PROVIDER_SITE_OTHER): Payer: 59 | Admitting: Family Medicine

## 2022-10-23 ENCOUNTER — Encounter: Payer: Self-pay | Admitting: Family Medicine

## 2022-10-23 VITALS — BP 116/72 | HR 73 | Temp 98.4°F | Resp 18 | Ht 67.5 in | Wt 127.0 lb

## 2022-10-23 DIAGNOSIS — Z1211 Encounter for screening for malignant neoplasm of colon: Secondary | ICD-10-CM | POA: Diagnosis not present

## 2022-10-23 DIAGNOSIS — Z0000 Encounter for general adult medical examination without abnormal findings: Secondary | ICD-10-CM | POA: Diagnosis not present

## 2022-10-23 DIAGNOSIS — R739 Hyperglycemia, unspecified: Secondary | ICD-10-CM

## 2022-10-23 DIAGNOSIS — E782 Mixed hyperlipidemia: Secondary | ICD-10-CM | POA: Diagnosis not present

## 2022-10-23 DIAGNOSIS — E538 Deficiency of other specified B group vitamins: Secondary | ICD-10-CM

## 2022-10-23 DIAGNOSIS — R21 Rash and other nonspecific skin eruption: Secondary | ICD-10-CM | POA: Diagnosis not present

## 2022-10-23 DIAGNOSIS — E559 Vitamin D deficiency, unspecified: Secondary | ICD-10-CM | POA: Diagnosis not present

## 2022-10-23 LAB — COMPREHENSIVE METABOLIC PANEL
ALT: 11 U/L (ref 0–35)
AST: 17 U/L (ref 0–37)
Albumin: 4.6 g/dL (ref 3.5–5.2)
Alkaline Phosphatase: 47 U/L (ref 39–117)
BUN: 14 mg/dL (ref 6–23)
CO2: 31 mEq/L (ref 19–32)
Calcium: 9.5 mg/dL (ref 8.4–10.5)
Chloride: 102 mEq/L (ref 96–112)
Creatinine, Ser: 0.92 mg/dL (ref 0.40–1.20)
GFR: 67.06 mL/min (ref >60.00)
Glucose, Bld: 94 mg/dL (ref 70–99)
Potassium: 4.1 mEq/L (ref 3.5–5.1)
Sodium: 139 mEq/L (ref 135–145)
Total Bilirubin: 0.6 mg/dL (ref 0.2–1.2)
Total Protein: 6.9 g/dL (ref 6.0–8.3)

## 2022-10-23 LAB — CBC WITH DIFFERENTIAL/PLATELET
Basophils Absolute: 0 10*3/uL (ref 0.0–0.1)
Basophils Relative: 0.6 % (ref 0.0–3.0)
Eosinophils Absolute: 0.1 10*3/uL (ref 0.0–0.7)
Eosinophils Relative: 2.4 % (ref 0.0–5.0)
HCT: 41.1 % (ref 36.0–46.0)
Hemoglobin: 13.7 g/dL (ref 12.0–15.0)
Lymphocytes Relative: 27.8 % (ref 12.0–46.0)
Lymphs Abs: 1.6 10*3/uL (ref 0.7–4.0)
MCHC: 33.2 g/dL (ref 30.0–36.0)
MCV: 88.5 fl (ref 78.0–100.0)
Monocytes Absolute: 0.4 10*3/uL (ref 0.1–1.0)
Monocytes Relative: 6.5 % (ref 3.0–12.0)
Neutro Abs: 3.6 10*3/uL (ref 1.4–7.7)
Neutrophils Relative %: 62.7 % (ref 43.0–77.0)
Platelets: 285 10*3/uL (ref 150.0–400.0)
RBC: 4.65 Mil/uL (ref 3.87–5.11)
RDW: 14.6 % (ref 11.5–15.5)
WBC: 5.8 10*3/uL (ref 4.0–10.5)

## 2022-10-23 LAB — VITAMIN D 25 HYDROXY (VIT D DEFICIENCY, FRACTURES): VITD: 40.61 ng/mL (ref 30.00–100.00)

## 2022-10-23 LAB — LIPID PANEL
Cholesterol: 227 mg/dL — ABNORMAL HIGH (ref 0–200)
HDL: 68.1 mg/dL (ref >39.00)
LDL Cholesterol: 144 mg/dL — ABNORMAL HIGH (ref 0–99)
NonHDL: 159.12
Total CHOL/HDL Ratio: 3
Triglycerides: 77 mg/dL (ref 0.0–149.0)
VLDL: 15.4 mg/dL (ref 0.0–40.0)

## 2022-10-23 LAB — HEMOGLOBIN A1C: Hgb A1c MFr Bld: 6.1 % (ref 4.6–6.5)

## 2022-10-23 LAB — TSH: TSH: 0.43 u[IU]/mL (ref 0.35–5.50)

## 2022-10-23 LAB — VITAMIN B12: Vitamin B-12: 365 pg/mL (ref 211–911)

## 2022-10-23 NOTE — Progress Notes (Signed)
Subjective:   By signing my name below, I, [name], attest that this documentation has been prepared under the direction and in the presence of Mosie Lukes, MD., 10/23/2022.     Patient ID: Dawn Burch, female    DOB: Aug 16, 1961, 61 y.o.   MRN: [id number]  Chief Complaint  Patient presents with   Annual Exam    Tick bites    HPI Patient is in today for a comprehensive physical exam and follow up on chronic medical concerns.   Patient denies having any fever, chills, congestion, sinus pain, sore throat, chest pain, wheezing, nausea, vomitting, abdominal pain, diarrhea, constipation, blood in stool, dysuria, urgency, frequency and hematuria.  FHx: Mother has AFIB.  Palpitations: Patient reports that she has palpitations that last for several seconds when laying on her side. This has been occurring for over 1 year. Denies nausea, sweatiness, SOB.   Immunizations: Patient is not interested in immunizations.  Tick-Borne Disease: Patient sustained 2 tick bites on her right foot in 07/2022 and is suspect that she could have a tick-borne disease. Denies associates symptoms of rash and itchiness.  Supplements: She does not take supplements anymore due to concern about consuming essential oils from vitamin E.   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   Fibrocystic breast 05/07/2014   Hyperlipidemia, mixed 10/21/2015   Plantar fasciitis, bilateral 10/12/2017   Past Surgical History:  Procedure Laterality Date   BREAST SURGERY  14 yrs ago   biopsy- left breast   HERNIA REPAIR  2010   SKIN SURGERY  20's   basal cell removal around right eye   Family History  Problem Relation Age of Onset   Heart Problems Mother        palpitations   Cancer Mother 62       breast- left   Diabetes Mother         type 2   Melanoma Mother    Atrial fibrillation Mother    Aneurysm Sister        bain with bleeds   Diabetes Brother    Alcoholism Brother    Other Son        recurrent concussion   Diabetes Son    Kidney disease Maternal Aunt    Cancer Maternal Grandmother 85       breast- left, skin on face, colon in 70s or 28s   Glaucoma Maternal Grandfather    Social History   Socioeconomic History   Marital status: Married    Spouse name: Not on file   Number of children: Not on file   Years of education: Not on file   Highest education level: Not on file  Occupational History   Not on file  Tobacco Use   Smoking status: Never   Smokeless tobacco: Never  Substance and Sexual Activity   Alcohol use: Yes    Comment: occasionally   Drug use: No   Sexual activity: Yes    Partners: Male    Comment: minimizes wheat. lives with husband, son  Other Topics Concern   Not on file  Social History Narrative   Not on file   Social Determinants of Health   Financial Resource Strain: Not on file  Food Insecurity:  Not on file  Transportation Needs: Not on file  Physical Activity: Not on file  Stress: Not on file  Social Connections: Not on file  Intimate Partner Violence: Not on file   Outpatient Medications Prior to Visit  Medication Sig Dispense Refill   bevacizumab (AVASTIN) 2.5 MG/0.1ML SOSY by Intravitreal route. (Patient not taking: Reported on 10/23/2022)     No facility-administered medications prior to visit.   Allergies  Allergen Reactions   Penicillins    Review of Systems  Constitutional:  Negative for chills and fever.  HENT:  Negative for congestion, sinus pain and sore throat.   Respiratory:  Negative for cough, shortness of breath and wheezing.   Cardiovascular:  Positive for palpitations. Negative for chest pain.  Gastrointestinal:  Negative for abdominal pain, blood in stool, constipation, diarrhea, nausea and vomiting.  Genitourinary:  Negative for  dysuria, frequency, hematuria and urgency.  Skin:  Positive for rash. Negative for itching.       (-) New moles.  Neurological:  Negative for headaches.      Objective:    Physical Exam Constitutional:      General: She is not in acute distress.    Appearance: Normal appearance. She is normal weight. She is not ill-appearing.  HENT:     Head: Normocephalic and atraumatic.     Right Ear: Tympanic membrane, ear canal and external ear normal.     Left Ear: Tympanic membrane, ear canal and external ear normal.     Nose: Nose normal.     Mouth/Throat:     Mouth: Mucous membranes are moist.     Pharynx: Oropharynx is clear.  Eyes:     General:        Right eye: No discharge.        Left eye: No discharge.     Extraocular Movements: Extraocular movements intact.     Right eye: No nystagmus.     Left eye: No nystagmus.     Pupils: Pupils are equal, round, and reactive to light.  Neck:     Vascular: No carotid bruit.  Cardiovascular:     Rate and Rhythm: Normal rate and regular rhythm.     Pulses: Normal pulses.     Heart sounds: Normal heart sounds. No murmur heard.    No gallop.  Pulmonary:     Effort: Pulmonary effort is normal. No respiratory distress.     Breath sounds: Normal breath sounds. No wheezing or rales.  Abdominal:     General: Bowel sounds are normal.     Tenderness: There is no abdominal tenderness. There is no guarding.  Musculoskeletal:        General: Normal range of motion.     Cervical back: Normal range of motion. No rigidity or tenderness.     Right lower leg: No edema.     Left lower leg: No edema.     Comments: Muscle strength 5/5 on upper and lower extremities.   Lymphadenopathy:     Cervical: No cervical adenopathy.  Skin:    General: Skin is warm and dry.     Findings: Rash present.     Comments: Two 1 cm violaceous spots on top of the right foot.  Neurological:     Mental Status: She is alert and oriented to person, place, and time.      Sensory: Sensation is intact.     Motor: Motor function is intact.     Coordination: Coordination is intact.  Deep Tendon Reflexes:     Reflex Scores:      Patellar reflexes are 2+ on the right side and 2+ on the left side. Psychiatric:        Mood and Affect: Mood normal.        Behavior: Behavior normal.        Judgment: Judgment normal.    BP 116/72 (BP Location: Left Arm, Patient Position: Sitting, Cuff Size: Normal)   Pulse 73   Temp 98.4 F (36.9 C) (Oral)   Resp 18   Ht 5' 7.5" (1.715 m)   Wt 127 lb (57.6 kg)   LMP 10/07/2015   SpO2 99%   BMI 19.60 kg/m  Wt Readings from Last 3 Encounters:  10/23/22 127 lb (57.6 kg)  10/22/21 126 lb 3.2 oz (57.2 kg)  10/18/20 132 lb 9.6 oz (60.1 kg)   Diabetic Foot Exam - Simple   No data filed    Lab Results  Component Value Date   WBC 5.8 10/23/2022   HGB 13.7 10/23/2022   HCT 41.1 10/23/2022   PLT 285.0 10/23/2022   GLUCOSE 94 10/23/2022   CHOL 227 (H) 10/23/2022   TRIG 77.0 10/23/2022   HDL 68.10 10/23/2022   LDLCALC 144 (H) 10/23/2022   ALT 11 10/23/2022   AST 17 10/23/2022   NA 139 10/23/2022   K 4.1 10/23/2022   CL 102 10/23/2022   CREATININE 0.92 10/23/2022   BUN 14 10/23/2022   CO2 31 10/23/2022   TSH 0.43 10/23/2022   HGBA1C 6.1 10/23/2022   Lab Results  Component Value Date   TSH 0.43 10/23/2022   Lab Results  Component Value Date   WBC 5.8 10/23/2022   HGB 13.7 10/23/2022   HCT 41.1 10/23/2022   MCV 88.5 10/23/2022   PLT 285.0 10/23/2022   Lab Results  Component Value Date   NA 139 10/23/2022   K 4.1 10/23/2022   CO2 31 10/23/2022   GLUCOSE 94 10/23/2022   BUN 14 10/23/2022   CREATININE 0.92 10/23/2022   BILITOT 0.6 10/23/2022   ALKPHOS 47 10/23/2022   AST 17 10/23/2022   ALT 11 10/23/2022   PROT 6.9 10/23/2022   ALBUMIN 4.6 10/23/2022   CALCIUM 9.5 10/23/2022   GFR 67.06 10/23/2022   Lab Results  Component Value Date   CHOL 227 (H) 10/23/2022   Lab Results  Component Value  Date   HDL 68.10 10/23/2022   Lab Results  Component Value Date   LDLCALC 144 (H) 10/23/2022   Lab Results  Component Value Date   TRIG 77.0 10/23/2022   Lab Results  Component Value Date   CHOLHDL 3 10/23/2022   Lab Results  Component Value Date   HGBA1C 6.1 10/23/2022   Colonoscopy: Last completed on 12/09/2011. Repeat in 10 years. Patient is interested in Cologuard.   DEXA: Last completed on 08/11/2012.   Mammogram: Last completed on 10/21/2015. No mammographic evidence of malignancy. Due.  Pap Smear: Last completed on 12/09/2011. Normal results. Due.    Assessment & Plan:  Patient will return in 1 year.  Problem List Items Addressed This Visit     Vitamin B12 deficiency    Supplement and monitor       Relevant Orders   Vitamin B12 (Completed)   Preventative health care    Patient encouraged to maintain heart healthy diet, regular exercise, adequate sleep. Consider daily probiotics. Take medications as prescribed. Labs ordered and reviewed  Consider flu, covid, tetanus, shingrix vaccines  patient declines for now.  Patient encouraged to proceed with colonoscopy but declines, agrees to proceed with Cologuard, order placed today Her gynecologist has retired so she acknowledges she has not had MGM or Pap in years. She chooses to stop doing them but is encouraged to consider getting at least one more of each before consideration of stopping.  Encouraged to start with Dexa scans but will not proceed today      Hyperlipidemia, mixed    Encourage heart healthy diet such as MIND or DASH diet, increase exercise, avoid trans fats, simple carbohydrates and processed foods, consider a krill or fish or flaxseed oil cap daily.        Relevant Orders   Lipid panel (Completed)   TSH (Completed)   Vitamin D deficiency    Supplement and monitor       Relevant Orders   VITAMIN D 25 Hydroxy (Vit-D Deficiency, Fractures) (Completed)   Hyperglycemia - Primary    hgba1c  acceptable, minimize simple carbs. Increase exercise as tolerated.       Relevant Orders   Comprehensive metabolic panel (Completed)   TSH (Completed)   Hemoglobin A1c (Completed)   Rash    She notes a rash on her right foot after 2 tick bites but no other concerning symptoms such as HA or arthralgias. Lyme, RMST and Ehrlichia levels are ordered today.       Relevant Orders   CBC with Differential/Platelet (Completed)   Ehrlichia antibody panel   B. burgdorfi antibodies   Rocky mtn spotted fvr abs pnl(IgG+IgM)   Other Visit Diagnoses     Colon cancer screening       Relevant Orders   Cologuard      No orders of the defined types were placed in this encounter.  I, Penni Homans, MD, personally preformed the services described in this documentation.  All medical record entries made by the scribe were at my direction and in my presence.  I have reviewed the chart and discharge instructions (if applicable) and agree that the record reflects my personal performance and is accurate and complete. 10/23/2022.0  I,Mohammed Iqbal,acting as a scribe for Penni Homans, MD.,have documented all relevant documentation on the behalf of Penni Homans, MD,as directed by  Penni Homans, MD while in the presence of Penni Homans, MD.  Penni Homans, MD

## 2022-10-23 NOTE — Assessment & Plan Note (Signed)
She notes a rash on her right foot after 2 tick bites but no other concerning symptoms such as HA or arthralgias. Lyme, RMST and Ehrlichia levels are ordered today.

## 2022-10-23 NOTE — Patient Instructions (Addendum)
[place] [employer]  Preventive Care 44-61 Years Old, Female Preventive care refers to lifestyle choices and visits with your health care provider that can promote health and wellness. Preventive care visits are also called wellness exams. What can I expect for my preventive care visit? Counseling Your health care provider may ask you questions about your: Medical history, including: Past medical problems. Family medical history. Pregnancy history. Current health, including: Menstrual cycle. Method of birth control. Emotional well-being. Home life and relationship well-being. Sexual activity and sexual health. Lifestyle, including: Alcohol, nicotine or tobacco, and drug use. Access to firearms. Diet, exercise, and sleep habits. Work and work Statistician. Sunscreen use. Safety issues such as seatbelt and bike helmet use. Physical exam Your health care provider will check your: Height and weight. These may be used to calculate your BMI (body mass index). BMI is a measurement that tells if you are at a healthy weight. Waist circumference. This measures the distance around your waistline. This measurement also tells if you are at a healthy weight and may help predict your risk of certain diseases, such as type 2 diabetes and high blood pressure. Heart rate and blood pressure. Body temperature. Skin for abnormal spots. What immunizations do I need?  Vaccines are usually given at various ages, according to a schedule. Your health care provider will recommend vaccines for you based on your age, medical history, and lifestyle or other factors, such as travel or where you work. What tests do I need? Screening Your health care provider may recommend screening tests for certain conditions. This may include: Lipid and cholesterol levels. Diabetes screening. This is done by checking your blood sugar (glucose) after you have not eaten for a while (fasting). Pelvic exam and Pap  test. Hepatitis B test. Hepatitis C test. HIV (human immunodeficiency virus) test. STI (sexually transmitted infection) testing, if you are at risk. Lung cancer screening. Colorectal cancer screening. Mammogram. Talk with your health care provider about when you should start having regular mammograms. This may depend on whether you have a family history of breast cancer. BRCA-related cancer screening. This may be done if you have a family history of breast, ovarian, tubal, or peritoneal cancers. Bone density scan. This is done to screen for osteoporosis. Talk with your health care provider about your test results, treatment options, and if necessary, the need for more tests. Follow these instructions at home: Eating and drinking  Eat a diet that includes fresh fruits and vegetables, whole grains, lean protein, and low-fat dairy products. Take vitamin and mineral supplements as recommended by your health care provider. Do not drink alcohol if: Your health care provider tells you not to drink. You are pregnant, may be pregnant, or are planning to become pregnant. If you drink alcohol: Limit how much you have to 0-1 drink a day. Know how much alcohol is in your drink. In the [place] one drink equals one 12 oz bottle of beer (355 mL), one 5 oz glass of wine (148 mL), or one 1 oz glass of hard liquor (44 mL). Lifestyle Brush your teeth every morning and night with fluoride toothpaste. Floss one time each day. Exercise for at least 30 minutes 5 or more days each week. Do not use any products that contain nicotine or tobacco. These products include cigarettes, chewing tobacco, and vaping devices, such as e-cigarettes. If you need help quitting, ask your health care provider. Do not use drugs. If you are sexually active, practice safe sex. Use a condom  or other form of protection to prevent STIs. If you do not wish to become pregnant, use a form of birth control. If you plan to become pregnant,  see your health care provider for a prepregnancy visit. Take aspirin only as told by your health care provider. Make sure that you understand how much to take and what form to take. Work with your health care provider to find out whether it is safe and beneficial for you to take aspirin daily. Find healthy ways to manage stress, such as: Meditation, yoga, or listening to music. Journaling. Talking to a trusted person. Spending time with friends and family. Minimize exposure to UV radiation to reduce your risk of skin cancer. Safety Always wear your seat belt while driving or riding in a vehicle. Do not drive: If you have been drinking alcohol. Do not ride with someone who has been drinking. When you are tired or distracted. While texting. If you have been using any mind-altering substances or drugs. Wear a helmet and other protective equipment during sports activities. If you have firearms in your house, make sure you follow all gun safety procedures. Seek help if you have been physically or sexually abused. What's next? Visit your health care provider once a year for an annual wellness visit. Ask your health care provider how often you should have your eyes and teeth checked. Stay up to date on all vaccines. This information is not intended to replace advice given to you by your health care provider. Make sure you discuss any questions you have with your health care provider. Document Revised: 05/22/2021 Document Reviewed: 05/22/2021 Elsevier Patient Education  [place].

## 2022-10-28 LAB — ROCKY MTN SPOTTED FVR ABS PNL(IGG+IGM)
RMSF IgG: DETECTED — AB
RMSF IgM: NOT DETECTED

## 2022-10-28 LAB — REFLEX RMSF IGG TITER: RMSF IgG Titer: 1:256 {titer} — ABNORMAL HIGH

## 2022-10-28 LAB — EHRLICHIA ANTIBODY PANEL
E. CHAFFEENSIS AB IGG: <1:64 {titer}
E. CHAFFEENSIS AB IGM: <1:20 {titer}

## 2022-10-28 LAB — B. BURGDORFI ANTIBODIES: B burgdorferi Ab IgG+IgM: <0.9 index

## 2022-10-31 DIAGNOSIS — H2513 Age-related nuclear cataract, bilateral: Secondary | ICD-10-CM | POA: Diagnosis not present

## 2022-10-31 DIAGNOSIS — H353211 Exudative age-related macular degeneration, right eye, with active choroidal neovascularization: Secondary | ICD-10-CM | POA: Diagnosis not present

## 2022-10-31 DIAGNOSIS — H353122 Nonexudative age-related macular degeneration, left eye, intermediate dry stage: Secondary | ICD-10-CM | POA: Diagnosis not present

## 2022-11-24 DIAGNOSIS — Z1211 Encounter for screening for malignant neoplasm of colon: Secondary | ICD-10-CM | POA: Diagnosis not present

## 2022-12-02 ENCOUNTER — Other Ambulatory Visit: Payer: Self-pay

## 2022-12-02 DIAGNOSIS — Z1211 Encounter for screening for malignant neoplasm of colon: Secondary | ICD-10-CM

## 2022-12-02 LAB — COLOGUARD: COLOGUARD: POSITIVE — AB

## 2023-01-07 DIAGNOSIS — H353212 Exudative age-related macular degeneration, right eye, with inactive choroidal neovascularization: Secondary | ICD-10-CM | POA: Diagnosis not present

## 2023-01-07 DIAGNOSIS — H353122 Nonexudative age-related macular degeneration, left eye, intermediate dry stage: Secondary | ICD-10-CM | POA: Diagnosis not present

## 2023-01-07 DIAGNOSIS — H2513 Age-related nuclear cataract, bilateral: Secondary | ICD-10-CM | POA: Diagnosis not present

## 2023-05-06 ENCOUNTER — Telehealth: Payer: Self-pay

## 2023-05-06 NOTE — Telephone Encounter (Signed)
Error

## 2023-05-06 NOTE — Telephone Encounter (Signed)
Called pt regarding VV Friday at 9:00,Pt  Agree to appt. Selena Batten has added visit.

## 2023-05-06 NOTE — Telephone Encounter (Signed)
Maybe we could set her up with a Virtual visit this Friday or next?  ----- Message -----  From: Kathi Ludwig, CMA  Sent: 05/06/2023  10:10 AM EDT  To: Bradd Canary, MD  Subject: FW: Appointment Request                            ----- Message -----  From: Eugenia Mcalpine  Sent: 05/06/2023   9:27 AM EDT  To: Kathi Ludwig, CMA  Subject: FW: Appointment Request                          Please see comments below  ----- Message -----  From: Erik Obey"  Sent: 05/05/2023   7:03 PM EDT  To: [facility]  Subject: Appointment Request                              Appointment Request From: Carmin Muskrat    With Provider: Danise Edge, MD [facility]]    Preferred Date Range: 05/06/2023 - 05/07/2023    Preferred Times: Any Time    Reason for visit: Request an Appointment    Comments:  I am having a pain lower left side of abdomine. I was wondering if it could be from - She does not have IgM positive titer which means not an active infection but her IgG is positive so she has had it at some point. We should treat with Doxycycline 100 mg twice daily x 10 days. Please send in #20 once patient is ware.  I did not take Doxycycline.         Appointment Request (Newest Message First) Bradd Canary, MD  You; Alysia Penna, RN2 minutes ago (10:36 AM)    Maybe we could set her up with a Virtual visit this Friday or next?     You routed conversation to Bradd Canary, MD29 minutes ago (10:10 AM)   Elvera Maria, NT routed conversation to You57 minutes ago (9:41 AM)   Carmin Muskrat "Kim"  P [facility] Pool1 hour ago (9:35 AM)    I am free to come in any day in the month of June as well. Is it possible for me to speak to her for a moment? I may not need to come in at all after speaking to her.    Eugenia Mcalpine  You1 hour ago (9:27 AM)   SP Please see comments below     Larrie Kass "Kim"1 hour ago (9:26 AM)   SP Good Morning, Dr. Abner Greenspan does not have an opening during the days you requested. Would you be okay to see another provider regarding the abdominal pain?     Carmin Muskrat "Kim"  P [facility] Pool15 hours ago (7:03 PM)    Appointment Request From: Carmin Muskrat   With Provider: Danise Edge, MD [facility]]   Preferred Date Range: 05/06/2023 - 05/07/2023   Preferred Times: Any Time   Reason for visit: Request an Appointment   Comments: I am having a pain lower left side of abdomine. I was wondering if it could be from - She does not have IgM positive titer which means not an active infection but her IgG is positive so she has  had it at some point. We should treat with Doxycycline 100 mg twice daily x 10 days. Please send in #20 once patient is ware. I did not take Doxycycline.

## 2023-05-06 NOTE — Telephone Encounter (Signed)
Message sent to provider 

## 2023-05-08 ENCOUNTER — Telehealth (INDEPENDENT_AMBULATORY_CARE_PROVIDER_SITE_OTHER): Payer: 59 | Admitting: Family Medicine

## 2023-05-08 DIAGNOSIS — R1030 Lower abdominal pain, unspecified: Secondary | ICD-10-CM | POA: Diagnosis not present

## 2023-05-08 DIAGNOSIS — R195 Other fecal abnormalities: Secondary | ICD-10-CM

## 2023-05-08 DIAGNOSIS — H353122 Nonexudative age-related macular degeneration, left eye, intermediate dry stage: Secondary | ICD-10-CM | POA: Diagnosis not present

## 2023-05-08 DIAGNOSIS — H353211 Exudative age-related macular degeneration, right eye, with active choroidal neovascularization: Secondary | ICD-10-CM | POA: Diagnosis not present

## 2023-05-08 DIAGNOSIS — H2513 Age-related nuclear cataract, bilateral: Secondary | ICD-10-CM | POA: Diagnosis not present

## 2023-05-09 ENCOUNTER — Encounter: Payer: Self-pay | Admitting: Family Medicine

## 2023-05-09 DIAGNOSIS — R1030 Lower abdominal pain, unspecified: Secondary | ICD-10-CM | POA: Insufficient documentation

## 2023-05-09 DIAGNOSIS — R195 Other fecal abnormalities: Secondary | ICD-10-CM | POA: Insufficient documentation

## 2023-05-09 NOTE — Assessment & Plan Note (Signed)
She is offered referral back to GI but agrees she will call and proceed to evaluation. She is aware this is key to ruling out cancer.

## 2023-05-09 NOTE — Progress Notes (Signed)
MyChart Video Visit    Virtual Visit via Video Note   This visit type was conducted due to national recommendations for restrictions regarding the COVID-19 Pandemic (e.g. social distancing) in an effort to limit this patient's exposure and mitigate transmission in our community. This patient is at least at moderate risk for complications without adequate follow up. This format is felt to be most appropriate for this patient at this time. Physical exam was limited by quality of the video and audio technology used for the visit. Elby Showers, CMAhome was able to get the patient set up on a video visit.  Patient location: home Patient and provider in visit Provider location: Office  I discussed the limitations of evaluation and management by telemedicine and the availability of in person appointments. The patient expressed understanding and agreed to proceed.  Visit Date: 05/08/2023  Today's healthcare provider: Danise Edge, MD     Subjective:    Patient ID: Dawn Burch, female    DOB: March 18, 1961, 62 y.o.   MRN: [id number]  No chief complaint on file.   HPI Patient is in today for evaluation of left sided abdominal pain and left hip pain. The pain is actually better today but has been notable with changes in position and bending. She does have to frequently lift her 49 year old 20 pound dog and she wonders if that is part of the issue. No change in bowel habits or appetite. No recent febrile illness, diarrhea or other acute concerns. Denies CP/palp/SOB/HA/congestion/fevers or GU c/o. Taking meds as prescribed. She has not proceeded with the GI worked up secondary to her positive cologuard yet but she agrees to proceed.   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   Fibrocystic breast 05/07/2014    Hyperlipidemia, mixed 10/21/2015   Plantar fasciitis, bilateral 10/12/2017    Past Surgical History:  Procedure Laterality Date   BREAST SURGERY  14 yrs ago   biopsy- left breast   HERNIA REPAIR  2010   SKIN SURGERY  20's   basal cell removal around right eye    Family History  Problem Relation Age of Onset   Heart Problems Mother        palpitations   Cancer Mother 54       breast- left   Diabetes Mother        type 2   Melanoma Mother    Atrial fibrillation Mother    Aneurysm Sister        bain with bleeds   Diabetes Brother    Alcoholism Brother    Other Son        recurrent concussion   Diabetes Son    Kidney disease Maternal Aunt    Cancer Maternal Grandmother 18       breast- left, skin on face, colon in 39s or 85s   Glaucoma Maternal Grandfather     Social History   Socioeconomic History   Marital status: Married    Spouse name: Not on file   Number of children: Not on file   Years of education: Not on file   Highest education level: Not on file  Occupational History   Not on file  Tobacco Use   Smoking status: Never  Smokeless tobacco: Never  Substance and Sexual Activity   Alcohol use: Yes    Comment: occasionally   Drug use: No   Sexual activity: Yes    Partners: Male    Comment: minimizes wheat. lives with husband, son  Other Topics Concern   Not on file  Social History Narrative   Not on file   Social Determinants of Health   Financial Resource Strain: Not on file  Food Insecurity: Not on file  Transportation Needs: Not on file  Physical Activity: Not on file  Stress: Not on file  Social Connections: Not on file  Intimate Partner Violence: Not on file    Outpatient Medications Prior to Visit  Medication Sig Dispense Refill   bevacizumab (AVASTIN) 2.5 MG/0.1ML SOSY by Intravitreal route. (Patient not taking: Reported on 10/23/2022)     No facility-administered medications prior to visit.    Allergies  Allergen Reactions    Penicillins     Review of Systems  Constitutional:  Negative for fever and malaise/fatigue.  HENT:  Negative for congestion.   Eyes:  Negative for blurred vision.  Respiratory:  Negative for shortness of breath.   Cardiovascular:  Negative for chest pain, palpitations and leg swelling.  Gastrointestinal:  Positive for abdominal pain. Negative for blood in stool, constipation, diarrhea, melena, nausea and vomiting.  Genitourinary:  Negative for dysuria and frequency.  Musculoskeletal:  Positive for joint pain. Negative for falls.  Skin:  Negative for rash.  Neurological:  Negative for dizziness, loss of consciousness and headaches.  Endo/Heme/Allergies:  Negative for environmental allergies.  Psychiatric/Behavioral:  Negative for depression. The patient is not nervous/anxious.        Objective:    Physical Exam Constitutional:      General: She is not in acute distress.    Appearance: Normal appearance. She is not ill-appearing or toxic-appearing.  HENT:     Head: Normocephalic and atraumatic.     Right Ear: External ear normal.     Left Ear: External ear normal.     Nose: Nose normal.  Eyes:     General:        Right eye: No discharge.        Left eye: No discharge.  Pulmonary:     Effort: Pulmonary effort is normal.  Skin:    Findings: No rash.  Neurological:     Mental Status: She is alert and oriented to person, place, and time.  Psychiatric:        Behavior: Behavior normal.     LMP 10/07/2015  Wt Readings from Last 3 Encounters:  10/23/22 127 lb (57.6 kg)  10/22/21 126 lb 3.2 oz (57.2 kg)  10/18/20 132 lb 9.6 oz (60.1 kg)       Assessment & Plan:  Lower abdominal pain Assessment & Plan: Is more of a pulling pain and most noted when she bends and twists. She has to carry her 59 year old dog frequently and their may be a correlation. She is actually feeling better today. Encouraged moist heat and gentle stretching as tolerated. May try NSAIDs and Tylenol as  directed and report if symptoms worsen or seek immediate care    Positive colorectal cancer screening using Cologuard test Assessment & Plan: She is offered referral back to GI but agrees she will call and proceed to evaluation. She is aware this is key to ruling out cancer.      I discussed the assessment and treatment plan with the patient. The patient  was provided an opportunity to ask questions and all were answered. The patient agreed with the plan and demonstrated an understanding of the instructions.   The patient was advised to call back or seek an in-person evaluation if the symptoms worsen or if the condition fails to improve as anticipated.  Danise Edge, MD [facility] [phone] (phone) [phone] (fax)  [facility]

## 2023-05-09 NOTE — Assessment & Plan Note (Addendum)
Is more of a pulling pain and most noted when she bends and twists. She has to carry her 62 year old dog frequently and their may be a correlation. She is actually feeling better today. Encouraged moist heat and gentle stretching as tolerated. May try NSAIDs and Tylenol as directed and report if symptoms worsen or seek immediate care

## 2023-05-11 ENCOUNTER — Encounter: Payer: Self-pay | Admitting: Family Medicine

## 2023-05-11 NOTE — Telephone Encounter (Signed)
Sent pt mychart message and called pt lvm to call our office.

## 2023-05-11 NOTE — Telephone Encounter (Signed)
Pt would prefer to go back to [facility].

## 2023-07-27 DIAGNOSIS — H353211 Exudative age-related macular degeneration, right eye, with active choroidal neovascularization: Secondary | ICD-10-CM | POA: Diagnosis not present

## 2023-07-27 DIAGNOSIS — H2513 Age-related nuclear cataract, bilateral: Secondary | ICD-10-CM | POA: Diagnosis not present

## 2023-07-27 DIAGNOSIS — H353122 Nonexudative age-related macular degeneration, left eye, intermediate dry stage: Secondary | ICD-10-CM | POA: Diagnosis not present

## 2023-08-31 DIAGNOSIS — H2513 Age-related nuclear cataract, bilateral: Secondary | ICD-10-CM | POA: Diagnosis not present

## 2023-08-31 DIAGNOSIS — H353122 Nonexudative age-related macular degeneration, left eye, intermediate dry stage: Secondary | ICD-10-CM | POA: Diagnosis not present

## 2023-08-31 DIAGNOSIS — H353211 Exudative age-related macular degeneration, right eye, with active choroidal neovascularization: Secondary | ICD-10-CM | POA: Diagnosis not present

## 2023-10-12 DIAGNOSIS — H2513 Age-related nuclear cataract, bilateral: Secondary | ICD-10-CM | POA: Diagnosis not present

## 2023-10-12 DIAGNOSIS — H353122 Nonexudative age-related macular degeneration, left eye, intermediate dry stage: Secondary | ICD-10-CM | POA: Diagnosis not present

## 2023-10-12 DIAGNOSIS — H353211 Exudative age-related macular degeneration, right eye, with active choroidal neovascularization: Secondary | ICD-10-CM | POA: Diagnosis not present

## 2023-10-26 ENCOUNTER — Encounter: Payer: 59 | Admitting: Family Medicine

## 2023-12-14 DIAGNOSIS — H353211 Exudative age-related macular degeneration, right eye, with active choroidal neovascularization: Secondary | ICD-10-CM | POA: Diagnosis not present

## 2023-12-14 DIAGNOSIS — H2513 Age-related nuclear cataract, bilateral: Secondary | ICD-10-CM | POA: Diagnosis not present

## 2023-12-14 DIAGNOSIS — H353122 Nonexudative age-related macular degeneration, left eye, intermediate dry stage: Secondary | ICD-10-CM | POA: Diagnosis not present

## 2024-01-12 ENCOUNTER — Encounter: Payer: 59 | Admitting: Family Medicine

## 2024-03-04 DIAGNOSIS — H353122 Nonexudative age-related macular degeneration, left eye, intermediate dry stage: Secondary | ICD-10-CM | POA: Diagnosis not present

## 2024-03-04 DIAGNOSIS — H2513 Age-related nuclear cataract, bilateral: Secondary | ICD-10-CM | POA: Diagnosis not present

## 2024-03-04 DIAGNOSIS — H353211 Exudative age-related macular degeneration, right eye, with active choroidal neovascularization: Secondary | ICD-10-CM | POA: Diagnosis not present

## 2024-04-12 ENCOUNTER — Encounter: Payer: 59 | Admitting: Family Medicine

## 2024-05-30 DIAGNOSIS — H353122 Nonexudative age-related macular degeneration, left eye, intermediate dry stage: Secondary | ICD-10-CM | POA: Diagnosis not present

## 2024-05-30 DIAGNOSIS — H2513 Age-related nuclear cataract, bilateral: Secondary | ICD-10-CM | POA: Diagnosis not present

## 2024-05-30 DIAGNOSIS — H353211 Exudative age-related macular degeneration, right eye, with active choroidal neovascularization: Secondary | ICD-10-CM | POA: Diagnosis not present

## 2024-08-22 DIAGNOSIS — H35362 Drusen (degenerative) of macula, left eye: Secondary | ICD-10-CM | POA: Diagnosis not present

## 2024-08-22 DIAGNOSIS — H2513 Age-related nuclear cataract, bilateral: Secondary | ICD-10-CM | POA: Diagnosis not present

## 2024-08-22 DIAGNOSIS — H353122 Nonexudative age-related macular degeneration, left eye, intermediate dry stage: Secondary | ICD-10-CM | POA: Diagnosis not present

## 2024-08-22 DIAGNOSIS — H353211 Exudative age-related macular degeneration, right eye, with active choroidal neovascularization: Secondary | ICD-10-CM | POA: Diagnosis not present

## 2024-11-14 DIAGNOSIS — H35362 Drusen (degenerative) of macula, left eye: Secondary | ICD-10-CM | POA: Diagnosis not present

## 2024-11-14 DIAGNOSIS — H2513 Age-related nuclear cataract, bilateral: Secondary | ICD-10-CM | POA: Diagnosis not present

## 2024-11-14 DIAGNOSIS — H353122 Nonexudative age-related macular degeneration, left eye, intermediate dry stage: Secondary | ICD-10-CM | POA: Diagnosis not present

## 2024-11-14 DIAGNOSIS — H353211 Exudative age-related macular degeneration, right eye, with active choroidal neovascularization: Secondary | ICD-10-CM | POA: Diagnosis not present
